# Patient Record
Sex: Male | Born: 1960 | Race: Black or African American | Hispanic: No | Marital: Single | Smoking: Former smoker
Health system: Southern US, Community
[De-identification: ages and names within clinical notes are randomized; demographics above are authoritative.]

## PROBLEM LIST (undated history)

## (undated) DIAGNOSIS — K591 Functional diarrhea: Secondary | ICD-10-CM

## (undated) DIAGNOSIS — B59 Pneumocystosis: Secondary | ICD-10-CM

## (undated) DIAGNOSIS — E538 Deficiency of other specified B group vitamins: Secondary | ICD-10-CM

## (undated) DIAGNOSIS — S86819A Strain of other muscle(s) and tendon(s) at lower leg level, unspecified leg, initial encounter: Secondary | ICD-10-CM

## (undated) DIAGNOSIS — K219 Gastro-esophageal reflux disease without esophagitis: Secondary | ICD-10-CM

## (undated) DIAGNOSIS — B20 Human immunodeficiency virus [HIV] disease: Secondary | ICD-10-CM

## (undated) DIAGNOSIS — E785 Hyperlipidemia, unspecified: Secondary | ICD-10-CM

## (undated) DIAGNOSIS — Z21 Asymptomatic human immunodeficiency virus [HIV] infection status: Secondary | ICD-10-CM

## (undated) DIAGNOSIS — B370 Candidal stomatitis: Secondary | ICD-10-CM

## (undated) HISTORY — DX: Functional diarrhea: K59.1

## (undated) HISTORY — DX: Human immunodeficiency virus (HIV) disease: B20

## (undated) HISTORY — DX: Asymptomatic human immunodeficiency virus (hiv) infection status: Z21

## (undated) HISTORY — DX: Pneumocystosis: B59

## (undated) HISTORY — DX: Deficiency of other specified B group vitamins: E53.8

## (undated) HISTORY — DX: Strain of other muscle(s) and tendon(s) at lower leg level, unspecified leg, initial encounter: S86.819A

## (undated) HISTORY — DX: Gastro-esophageal reflux disease without esophagitis: K21.9

## (undated) HISTORY — DX: Candidal stomatitis: B37.0

## (undated) HISTORY — DX: Hyperlipidemia, unspecified: E78.5

---

## 1998-04-13 ENCOUNTER — Encounter: Admission: RE | Admit: 1998-04-13 | Discharge: 1998-04-13 | Payer: Self-pay | Admitting: Internal Medicine

## 1998-12-01 ENCOUNTER — Encounter: Payer: Self-pay | Admitting: Emergency Medicine

## 1998-12-01 ENCOUNTER — Inpatient Hospital Stay (HOSPITAL_COMMUNITY): Admission: EM | Admit: 1998-12-01 | Discharge: 1998-12-04 | Payer: Self-pay | Admitting: Emergency Medicine

## 1999-05-01 ENCOUNTER — Ambulatory Visit (HOSPITAL_COMMUNITY): Admission: RE | Admit: 1999-05-01 | Discharge: 1999-05-01 | Payer: Self-pay | Admitting: Internal Medicine

## 1999-05-01 ENCOUNTER — Encounter: Admission: RE | Admit: 1999-05-01 | Discharge: 1999-05-01 | Payer: Self-pay | Admitting: Internal Medicine

## 1999-06-12 ENCOUNTER — Ambulatory Visit (HOSPITAL_COMMUNITY): Admission: RE | Admit: 1999-06-12 | Discharge: 1999-06-12 | Payer: Self-pay | Admitting: Internal Medicine

## 1999-06-12 ENCOUNTER — Encounter: Admission: RE | Admit: 1999-06-12 | Discharge: 1999-06-12 | Payer: Self-pay | Admitting: Internal Medicine

## 1999-06-26 ENCOUNTER — Encounter: Admission: RE | Admit: 1999-06-26 | Discharge: 1999-06-26 | Payer: Self-pay | Admitting: Hematology and Oncology

## 1999-08-18 ENCOUNTER — Encounter: Payer: Self-pay | Admitting: Emergency Medicine

## 1999-08-18 ENCOUNTER — Emergency Department (HOSPITAL_COMMUNITY): Admission: EM | Admit: 1999-08-18 | Discharge: 1999-08-18 | Payer: Self-pay | Admitting: Emergency Medicine

## 1999-10-02 ENCOUNTER — Ambulatory Visit (HOSPITAL_COMMUNITY): Admission: RE | Admit: 1999-10-02 | Discharge: 1999-10-02 | Payer: Self-pay | Admitting: Internal Medicine

## 1999-10-02 ENCOUNTER — Encounter: Admission: RE | Admit: 1999-10-02 | Discharge: 1999-10-02 | Payer: Self-pay | Admitting: Internal Medicine

## 1999-12-31 ENCOUNTER — Encounter: Admission: RE | Admit: 1999-12-31 | Discharge: 1999-12-31 | Payer: Self-pay | Admitting: Internal Medicine

## 1999-12-31 ENCOUNTER — Ambulatory Visit (HOSPITAL_COMMUNITY): Admission: RE | Admit: 1999-12-31 | Discharge: 1999-12-31 | Payer: Self-pay | Admitting: Internal Medicine

## 2000-02-25 ENCOUNTER — Encounter: Admission: RE | Admit: 2000-02-25 | Discharge: 2000-02-25 | Payer: Self-pay | Admitting: Internal Medicine

## 2000-05-12 ENCOUNTER — Ambulatory Visit (HOSPITAL_COMMUNITY): Admission: RE | Admit: 2000-05-12 | Discharge: 2000-05-12 | Payer: Self-pay | Admitting: Internal Medicine

## 2000-05-12 ENCOUNTER — Encounter: Admission: RE | Admit: 2000-05-12 | Discharge: 2000-05-12 | Payer: Self-pay | Admitting: Internal Medicine

## 2000-09-29 ENCOUNTER — Encounter: Admission: RE | Admit: 2000-09-29 | Discharge: 2000-09-29 | Payer: Self-pay | Admitting: Internal Medicine

## 2000-12-01 ENCOUNTER — Encounter: Admission: RE | Admit: 2000-12-01 | Discharge: 2000-12-01 | Payer: Self-pay | Admitting: Internal Medicine

## 2000-12-14 ENCOUNTER — Inpatient Hospital Stay (HOSPITAL_COMMUNITY): Admission: EM | Admit: 2000-12-14 | Discharge: 2000-12-17 | Payer: Self-pay | Admitting: Emergency Medicine

## 2000-12-14 ENCOUNTER — Encounter: Payer: Self-pay | Admitting: Emergency Medicine

## 2000-12-16 ENCOUNTER — Encounter: Payer: Self-pay | Admitting: Orthopedic Surgery

## 2001-01-08 ENCOUNTER — Encounter: Admission: RE | Admit: 2001-01-08 | Discharge: 2001-04-08 | Payer: Self-pay | Admitting: Orthopedic Surgery

## 2001-01-20 ENCOUNTER — Ambulatory Visit (HOSPITAL_COMMUNITY): Admission: RE | Admit: 2001-01-20 | Discharge: 2001-01-20 | Payer: Self-pay | Admitting: Internal Medicine

## 2001-01-20 ENCOUNTER — Encounter: Admission: RE | Admit: 2001-01-20 | Discharge: 2001-01-20 | Payer: Self-pay | Admitting: Internal Medicine

## 2001-04-09 ENCOUNTER — Encounter: Admission: RE | Admit: 2001-04-09 | Discharge: 2001-05-03 | Payer: Self-pay | Admitting: Orthopedic Surgery

## 2001-04-14 ENCOUNTER — Encounter: Admission: RE | Admit: 2001-04-14 | Discharge: 2001-04-14 | Payer: Self-pay | Admitting: Internal Medicine

## 2001-06-01 ENCOUNTER — Encounter: Admission: RE | Admit: 2001-06-01 | Discharge: 2001-06-01 | Payer: Self-pay | Admitting: Internal Medicine

## 2001-10-18 ENCOUNTER — Encounter: Admission: RE | Admit: 2001-10-18 | Discharge: 2001-10-18 | Payer: Self-pay | Admitting: Internal Medicine

## 2001-11-30 ENCOUNTER — Encounter: Admission: RE | Admit: 2001-11-30 | Discharge: 2001-11-30 | Payer: Self-pay | Admitting: Internal Medicine

## 2002-01-24 ENCOUNTER — Encounter: Admission: RE | Admit: 2002-01-24 | Discharge: 2002-01-24 | Payer: Self-pay | Admitting: Internal Medicine

## 2002-01-24 ENCOUNTER — Ambulatory Visit (HOSPITAL_COMMUNITY): Admission: RE | Admit: 2002-01-24 | Discharge: 2002-01-24 | Payer: Self-pay | Admitting: Internal Medicine

## 2002-03-09 ENCOUNTER — Encounter: Admission: RE | Admit: 2002-03-09 | Discharge: 2002-03-09 | Payer: Self-pay | Admitting: Internal Medicine

## 2002-07-25 ENCOUNTER — Encounter: Admission: RE | Admit: 2002-07-25 | Discharge: 2002-07-25 | Payer: Self-pay | Admitting: Internal Medicine

## 2002-09-27 ENCOUNTER — Encounter: Admission: RE | Admit: 2002-09-27 | Discharge: 2002-09-27 | Payer: Self-pay | Admitting: Internal Medicine

## 2002-10-31 ENCOUNTER — Encounter: Admission: RE | Admit: 2002-10-31 | Discharge: 2002-10-31 | Payer: Self-pay | Admitting: Internal Medicine

## 2003-01-16 ENCOUNTER — Encounter: Admission: RE | Admit: 2003-01-16 | Discharge: 2003-01-16 | Payer: Self-pay | Admitting: Internal Medicine

## 2003-02-09 ENCOUNTER — Encounter: Admission: RE | Admit: 2003-02-09 | Discharge: 2003-05-10 | Payer: Self-pay | Admitting: Internal Medicine

## 2003-05-01 ENCOUNTER — Encounter: Admission: RE | Admit: 2003-05-01 | Discharge: 2003-05-01 | Payer: Self-pay | Admitting: Internal Medicine

## 2003-05-18 ENCOUNTER — Encounter: Admission: RE | Admit: 2003-05-18 | Discharge: 2003-05-18 | Payer: Self-pay | Admitting: Internal Medicine

## 2003-06-02 ENCOUNTER — Encounter: Admission: RE | Admit: 2003-06-02 | Discharge: 2003-06-02 | Payer: Self-pay | Admitting: Internal Medicine

## 2003-08-23 ENCOUNTER — Encounter: Admission: RE | Admit: 2003-08-23 | Discharge: 2003-08-23 | Payer: Self-pay | Admitting: Internal Medicine

## 2003-08-23 ENCOUNTER — Ambulatory Visit (HOSPITAL_COMMUNITY): Admission: RE | Admit: 2003-08-23 | Discharge: 2003-08-23 | Payer: Self-pay | Admitting: Internal Medicine

## 2003-08-23 ENCOUNTER — Encounter: Payer: Self-pay | Admitting: Internal Medicine

## 2004-01-05 ENCOUNTER — Encounter: Admission: RE | Admit: 2004-01-05 | Discharge: 2004-01-05 | Payer: Self-pay | Admitting: Internal Medicine

## 2004-01-12 ENCOUNTER — Encounter: Admission: RE | Admit: 2004-01-12 | Discharge: 2004-01-12 | Payer: Self-pay | Admitting: Internal Medicine

## 2004-01-12 ENCOUNTER — Ambulatory Visit (HOSPITAL_COMMUNITY): Admission: RE | Admit: 2004-01-12 | Discharge: 2004-01-12 | Payer: Self-pay | Admitting: Internal Medicine

## 2004-08-28 ENCOUNTER — Ambulatory Visit (HOSPITAL_COMMUNITY): Admission: RE | Admit: 2004-08-28 | Discharge: 2004-08-28 | Payer: Self-pay | Admitting: Internal Medicine

## 2004-08-28 ENCOUNTER — Ambulatory Visit: Payer: Self-pay | Admitting: Internal Medicine

## 2004-11-20 ENCOUNTER — Ambulatory Visit: Payer: Self-pay | Admitting: Internal Medicine

## 2004-11-27 ENCOUNTER — Ambulatory Visit: Payer: Self-pay | Admitting: Internal Medicine

## 2004-11-27 ENCOUNTER — Ambulatory Visit (HOSPITAL_COMMUNITY): Admission: RE | Admit: 2004-11-27 | Discharge: 2004-11-27 | Payer: Self-pay | Admitting: Infectious Diseases

## 2004-12-26 ENCOUNTER — Ambulatory Visit: Payer: Self-pay | Admitting: Internal Medicine

## 2004-12-26 ENCOUNTER — Ambulatory Visit (HOSPITAL_COMMUNITY): Admission: RE | Admit: 2004-12-26 | Discharge: 2004-12-26 | Payer: Self-pay | Admitting: Internal Medicine

## 2005-03-31 ENCOUNTER — Ambulatory Visit: Payer: Self-pay | Admitting: Internal Medicine

## 2005-03-31 ENCOUNTER — Ambulatory Visit (HOSPITAL_COMMUNITY): Admission: RE | Admit: 2005-03-31 | Discharge: 2005-03-31 | Payer: Self-pay | Admitting: Internal Medicine

## 2005-11-26 ENCOUNTER — Ambulatory Visit: Payer: Self-pay | Admitting: Internal Medicine

## 2005-11-26 ENCOUNTER — Ambulatory Visit (HOSPITAL_COMMUNITY): Admission: RE | Admit: 2005-11-26 | Discharge: 2005-11-26 | Payer: Self-pay | Admitting: Internal Medicine

## 2005-12-05 ENCOUNTER — Ambulatory Visit: Payer: Self-pay | Admitting: Internal Medicine

## 2006-03-16 ENCOUNTER — Ambulatory Visit: Payer: Self-pay | Admitting: Internal Medicine

## 2006-03-16 ENCOUNTER — Encounter: Admission: RE | Admit: 2006-03-16 | Discharge: 2006-03-16 | Payer: Self-pay | Admitting: Internal Medicine

## 2006-03-20 ENCOUNTER — Ambulatory Visit: Payer: Self-pay | Admitting: Internal Medicine

## 2006-03-27 ENCOUNTER — Ambulatory Visit: Payer: Self-pay | Admitting: Internal Medicine

## 2006-04-27 ENCOUNTER — Ambulatory Visit: Payer: Self-pay | Admitting: Internal Medicine

## 2006-05-04 ENCOUNTER — Ambulatory Visit: Payer: Self-pay | Admitting: Internal Medicine

## 2006-05-15 ENCOUNTER — Ambulatory Visit: Payer: Self-pay | Admitting: Internal Medicine

## 2006-05-22 ENCOUNTER — Ambulatory Visit: Payer: Self-pay | Admitting: Internal Medicine

## 2006-06-22 ENCOUNTER — Ambulatory Visit: Payer: Self-pay | Admitting: Internal Medicine

## 2006-07-22 ENCOUNTER — Ambulatory Visit: Payer: Self-pay | Admitting: Internal Medicine

## 2006-09-18 DIAGNOSIS — B20 Human immunodeficiency virus [HIV] disease: Secondary | ICD-10-CM | POA: Insufficient documentation

## 2006-09-18 DIAGNOSIS — E785 Hyperlipidemia, unspecified: Secondary | ICD-10-CM | POA: Insufficient documentation

## 2006-09-18 DIAGNOSIS — B59 Pneumocystosis: Secondary | ICD-10-CM | POA: Insufficient documentation

## 2006-09-18 DIAGNOSIS — E538 Deficiency of other specified B group vitamins: Secondary | ICD-10-CM | POA: Insufficient documentation

## 2006-09-18 DIAGNOSIS — M66269 Spontaneous rupture of extensor tendons, unspecified lower leg: Secondary | ICD-10-CM | POA: Insufficient documentation

## 2006-09-18 DIAGNOSIS — B370 Candidal stomatitis: Secondary | ICD-10-CM | POA: Insufficient documentation

## 2006-10-22 ENCOUNTER — Emergency Department (HOSPITAL_COMMUNITY): Admission: EM | Admit: 2006-10-22 | Discharge: 2006-10-22 | Payer: Self-pay | Admitting: Emergency Medicine

## 2006-11-02 ENCOUNTER — Ambulatory Visit: Payer: Self-pay | Admitting: Internal Medicine

## 2006-11-02 ENCOUNTER — Encounter: Admission: RE | Admit: 2006-11-02 | Discharge: 2006-11-02 | Payer: Self-pay | Admitting: Internal Medicine

## 2006-12-08 ENCOUNTER — Telehealth: Payer: Self-pay | Admitting: Internal Medicine

## 2007-03-18 ENCOUNTER — Telehealth (INDEPENDENT_AMBULATORY_CARE_PROVIDER_SITE_OTHER): Payer: Self-pay | Admitting: *Deleted

## 2007-07-27 ENCOUNTER — Encounter (INDEPENDENT_AMBULATORY_CARE_PROVIDER_SITE_OTHER): Payer: Self-pay | Admitting: Internal Medicine

## 2007-07-27 ENCOUNTER — Ambulatory Visit: Payer: Self-pay | Admitting: Internal Medicine

## 2007-07-27 ENCOUNTER — Encounter: Admission: RE | Admit: 2007-07-27 | Discharge: 2007-07-27 | Payer: Self-pay | Admitting: Internal Medicine

## 2007-07-29 LAB — CONVERTED CEMR LAB
Alkaline Phosphatase: 72 units/L (ref 39–117)
BUN: 12 mg/dL (ref 6–23)
CO2: 19 meq/L (ref 19–32)
Cholesterol: 234 mg/dL — ABNORMAL HIGH (ref 0–200)
Creatinine, Ser: 0.9 mg/dL (ref 0.40–1.50)
Glucose, Bld: 94 mg/dL (ref 70–99)
HCT: 44.4 % (ref 39.0–52.0)
HDL: 54 mg/dL (ref >39)
LDL Cholesterol: 132 mg/dL — ABNORMAL HIGH (ref 0–99)
MCHC: 36.9 g/dL — ABNORMAL HIGH (ref 30.0–36.0)
MCV: 107.8 fL — ABNORMAL HIGH (ref 78.0–100.0)
RBC: 4.12 M/uL — ABNORMAL LOW (ref 4.22–5.81)
Sodium: 141 meq/L (ref 135–145)
Total Bilirubin: 0.5 mg/dL (ref 0.3–1.2)
Total CHOL/HDL Ratio: 4.3
Triglycerides: 238 mg/dL — ABNORMAL HIGH (ref <150)
VLDL: 48 mg/dL — ABNORMAL HIGH (ref 0–40)
WBC: 6.8 10*3/uL (ref 4.0–10.5)

## 2007-10-07 ENCOUNTER — Ambulatory Visit: Payer: Self-pay | Admitting: Internal Medicine

## 2007-10-07 DIAGNOSIS — K5900 Constipation, unspecified: Secondary | ICD-10-CM | POA: Insufficient documentation

## 2008-03-16 IMAGING — CR DG CHEST 2V
2 series · 2 of 2 positions shown · non-contrast
Comparison: 03/31/05.

CLINICAL DATA: Right chest pain with deep inspiration.
 CHEST ? 2 VIEW:

[view not recorded (1 of 2)]
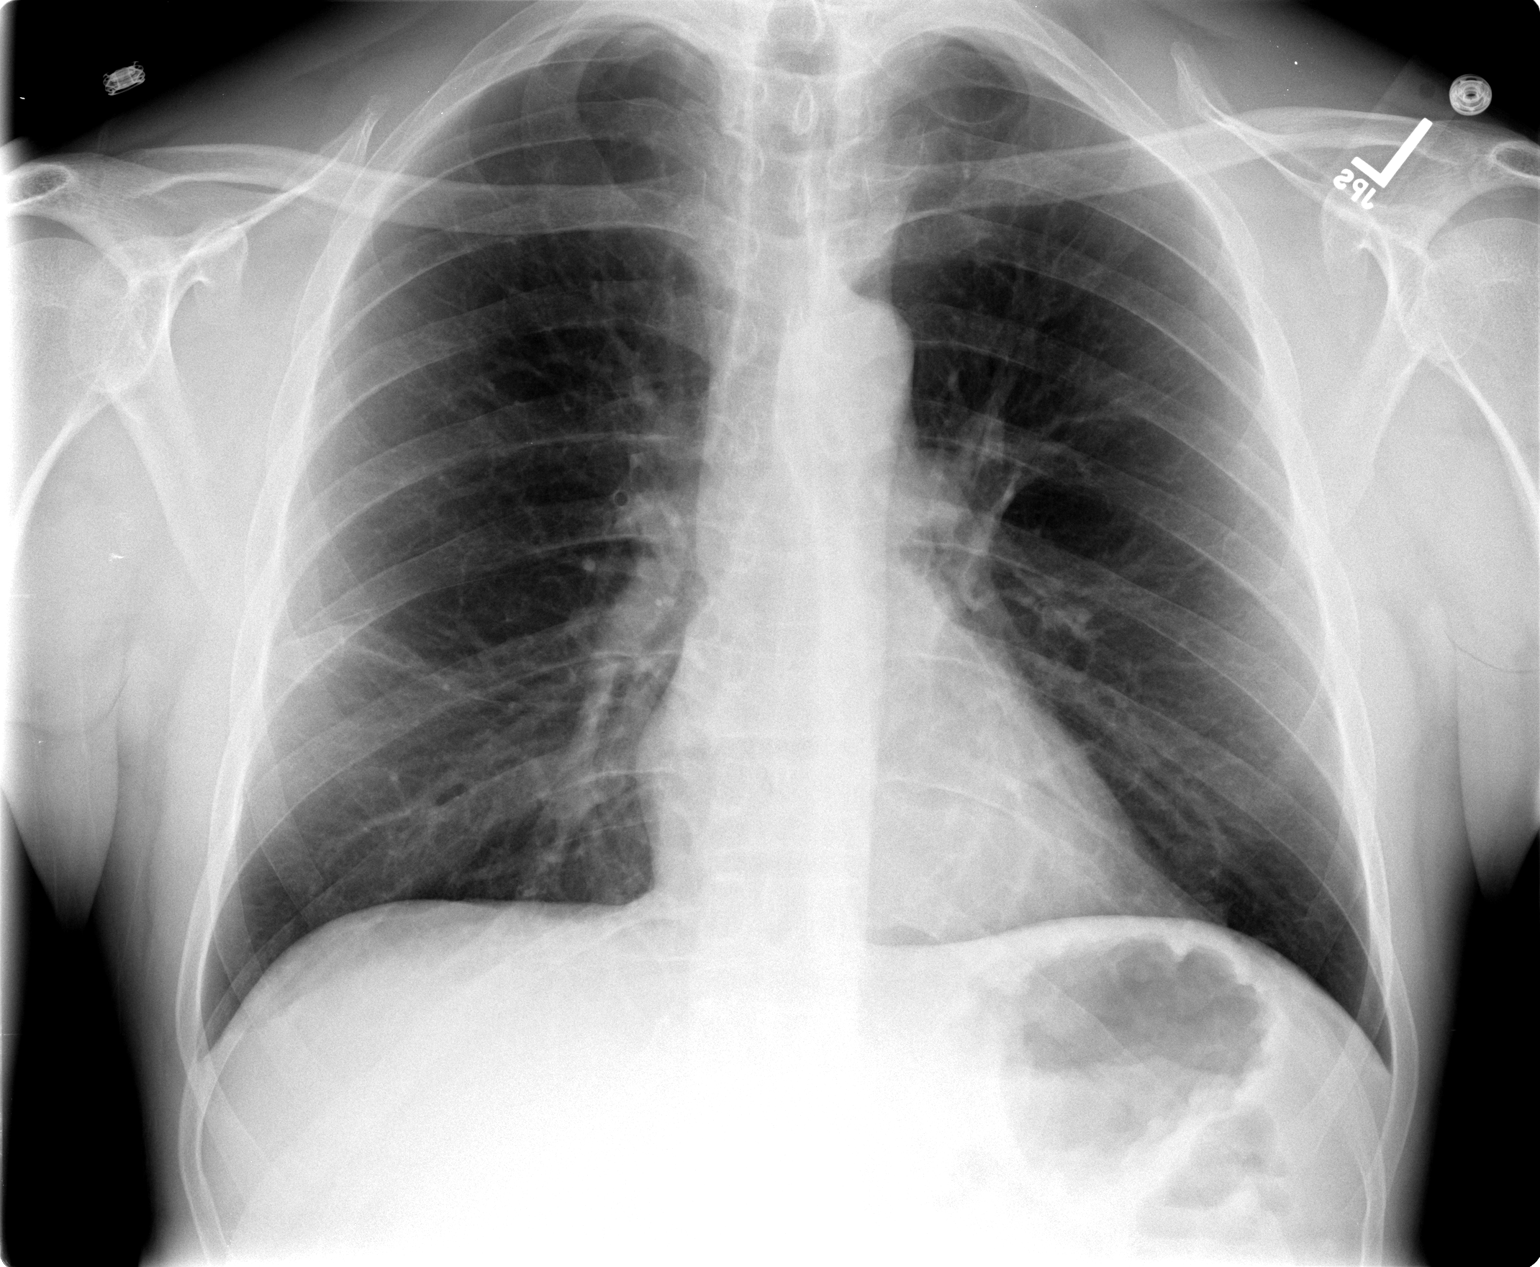

[view not recorded (2 of 2)]
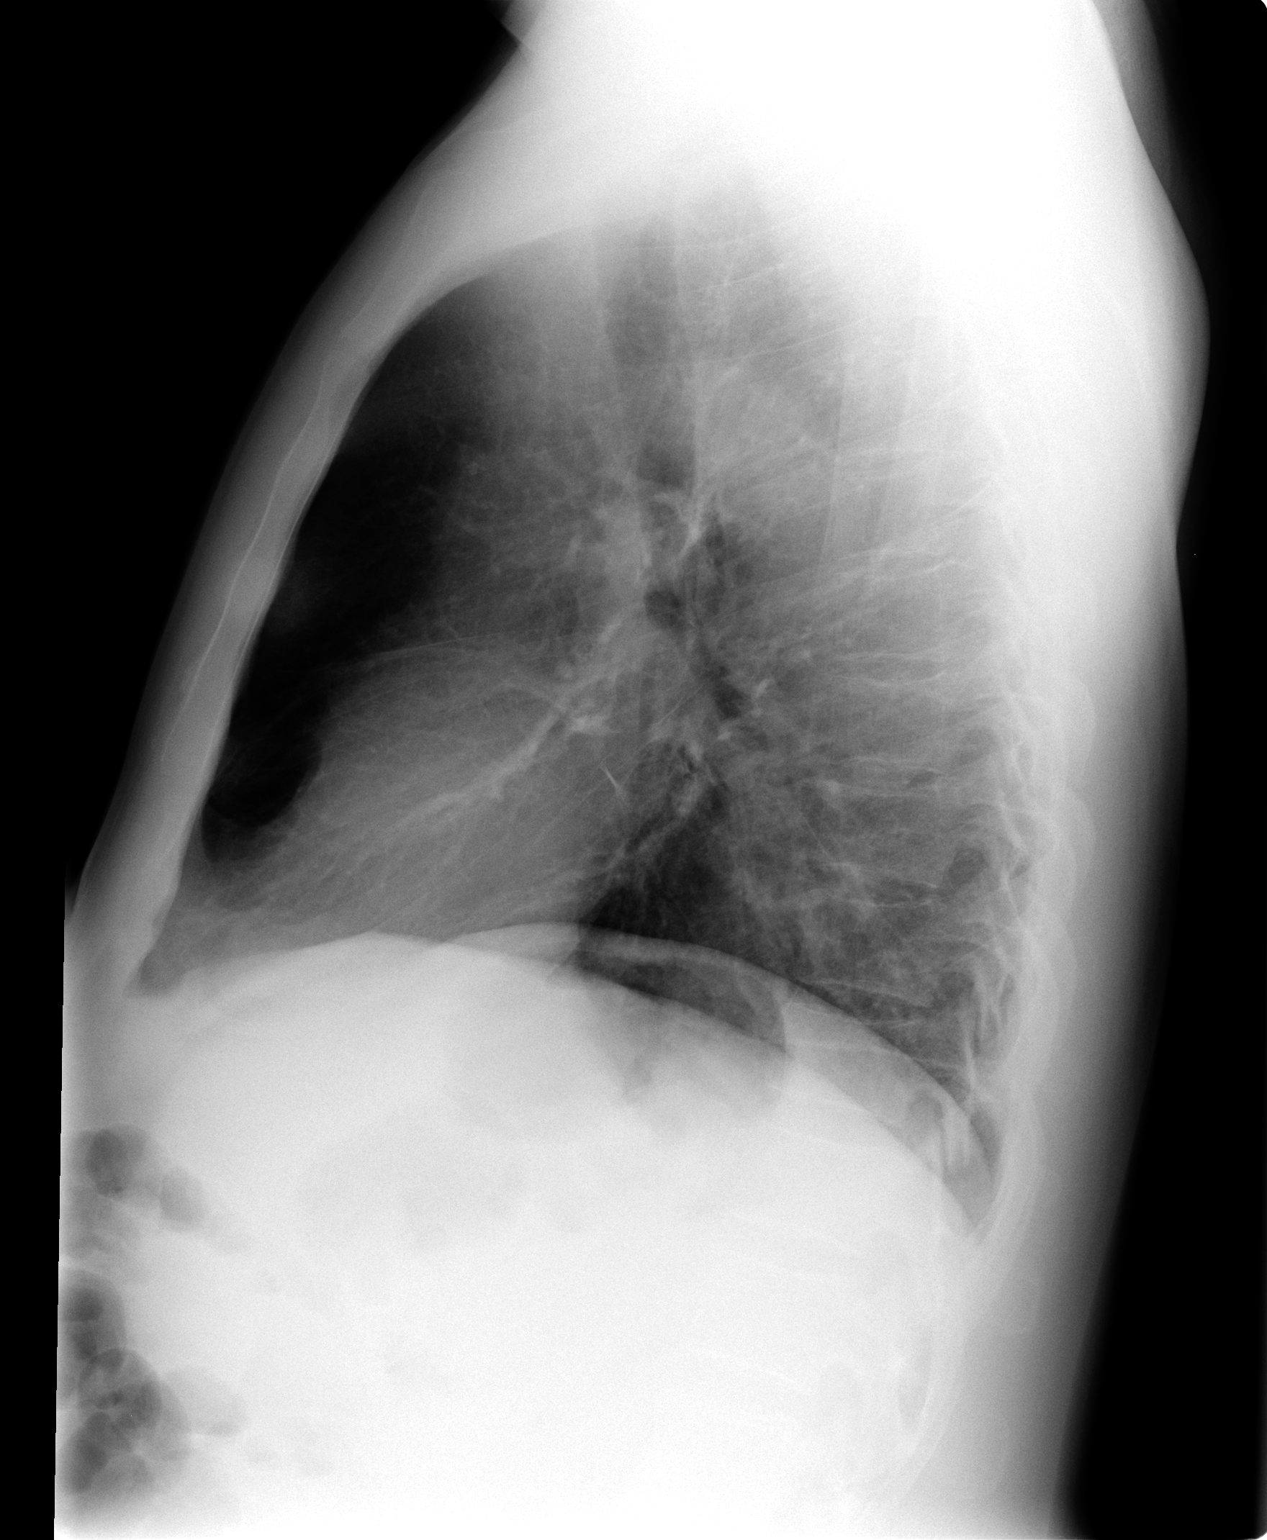

[2 of 2 positions shown; findings below may reference images not displayed]

FINDINGS: Heart and vascularity normal.  Peribronchial thickening and hyperaeration consistent with at least some degree of COPD.  No pneumothorax or acute findings.  No pleural fluid.  Osseous structures intact.
IMPRESSION: No active disease.  There are chronic lung changes as described above.

## 2008-06-13 ENCOUNTER — Encounter: Payer: Self-pay | Admitting: Internal Medicine

## 2008-06-13 ENCOUNTER — Ambulatory Visit: Payer: Self-pay | Admitting: Internal Medicine

## 2008-06-13 ENCOUNTER — Encounter: Admission: RE | Admit: 2008-06-13 | Discharge: 2008-06-13 | Payer: Self-pay | Admitting: Internal Medicine

## 2008-06-13 DIAGNOSIS — K219 Gastro-esophageal reflux disease without esophagitis: Secondary | ICD-10-CM | POA: Insufficient documentation

## 2008-06-13 LAB — CONVERTED CEMR LAB
AST: 21 units/L (ref 0–37)
Alkaline Phosphatase: 76 units/L (ref 39–117)
Basophils Relative: 0 % (ref 0–1)
Eosinophils Absolute: 0 10*3/uL (ref 0.0–0.7)
Eosinophils Relative: 0 % (ref 0–5)
Glucose, Bld: 93 mg/dL (ref 70–99)
HCT: 46.1 % (ref 39.0–52.0)
HDL: 51 mg/dL (ref >39)
Hepatitis B Surface Ag: NEGATIVE
Lymphs Abs: 3.2 10*3/uL (ref 0.7–4.0)
MCHC: 35.8 g/dL (ref 30.0–36.0)
MCV: 108.5 fL — ABNORMAL HIGH (ref 78.0–100.0)
Neutrophils Relative %: 56 % (ref 43–77)
Platelets: 213 10*3/uL (ref 150–400)
RDW: 13.8 % (ref 11.5–15.5)
Sodium: 140 meq/L (ref 135–145)
Total Bilirubin: 0.4 mg/dL (ref 0.3–1.2)
Total Protein: 8.3 g/dL (ref 6.0–8.3)
Triglycerides: 434 mg/dL — ABNORMAL HIGH (ref <150)
WBC: 8.7 10*3/uL (ref 4.0–10.5)

## 2008-07-17 ENCOUNTER — Encounter: Payer: Self-pay | Admitting: Internal Medicine

## 2008-07-17 ENCOUNTER — Ambulatory Visit: Payer: Self-pay | Admitting: Internal Medicine

## 2008-07-17 DIAGNOSIS — E872 Acidosis, unspecified: Secondary | ICD-10-CM | POA: Insufficient documentation

## 2008-07-17 LAB — CONVERTED CEMR LAB
BUN: 13 mg/dL (ref 6–23)
CO2: 22 meq/L (ref 19–32)
Chloride: 107 meq/L (ref 96–112)
Creatinine, Ser: 0.92 mg/dL (ref 0.40–1.50)

## 2008-08-24 ENCOUNTER — Ambulatory Visit: Payer: Self-pay | Admitting: Internal Medicine

## 2008-11-13 ENCOUNTER — Telehealth: Payer: Self-pay | Admitting: Infectious Diseases

## 2009-01-17 ENCOUNTER — Telehealth: Payer: Self-pay | Admitting: *Deleted

## 2009-05-25 ENCOUNTER — Encounter: Payer: Self-pay | Admitting: Internal Medicine

## 2009-05-25 ENCOUNTER — Ambulatory Visit: Payer: Self-pay | Admitting: Internal Medicine

## 2009-05-25 LAB — CONVERTED CEMR LAB
BUN: 6 mg/dL (ref 6–23)
CO2: 24 meq/L (ref 19–32)
Cholesterol: 190 mg/dL (ref 0–200)
Creatinine, Ser: 0.84 mg/dL (ref 0.40–1.50)
Glucose, Bld: 99 mg/dL (ref 70–99)
HCT: 46.7 % (ref 39.0–52.0)
HIV-1 RNA Quant, Log: 1.75 — ABNORMAL HIGH (ref <1.68)
Hemoglobin: 16.4 g/dL (ref 13.0–17.0)
MCV: 116.5 fL — ABNORMAL HIGH (ref 78.0–100.0)
RBC: 4.01 M/uL — ABNORMAL LOW (ref 4.22–5.81)
Total Bilirubin: 0.7 mg/dL (ref 0.3–1.2)
Total CHOL/HDL Ratio: 3.8
Total lymphocyte count: 2698 cells/mcL (ref 700–3300)
VLDL: 49 mg/dL — ABNORMAL HIGH (ref 0–40)
Vitamin B-12: 540 pg/mL (ref 211–911)
WBC: 7.2 10*3/uL (ref 4.0–10.5)

## 2009-09-17 ENCOUNTER — Telehealth: Payer: Self-pay | Admitting: Internal Medicine

## 2009-09-19 ENCOUNTER — Emergency Department (HOSPITAL_COMMUNITY): Admission: EM | Admit: 2009-09-19 | Discharge: 2009-09-19 | Payer: Self-pay | Admitting: Emergency Medicine

## 2009-09-19 ENCOUNTER — Encounter (INDEPENDENT_AMBULATORY_CARE_PROVIDER_SITE_OTHER): Payer: Self-pay | Admitting: Emergency Medicine

## 2009-09-19 ENCOUNTER — Ambulatory Visit: Payer: Self-pay | Admitting: Vascular Surgery

## 2009-09-25 ENCOUNTER — Ambulatory Visit: Payer: Self-pay | Admitting: Internal Medicine

## 2009-09-25 DIAGNOSIS — I82409 Acute embolism and thrombosis of unspecified deep veins of unspecified lower extremity: Secondary | ICD-10-CM | POA: Insufficient documentation

## 2009-09-25 LAB — CONVERTED CEMR LAB
BUN: 8 mg/dL (ref 6–23)
Basophils Absolute: 0 10*3/uL (ref 0.0–0.1)
Chloride: 104 meq/L (ref 96–112)
Eosinophils Relative: 1 % (ref 0–5)
HCT: 41.6 % (ref 39.0–52.0)
HIV-1 RNA Quant, Log: <1.68 (ref <1.68)
Hemoglobin: 14.5 g/dL (ref 13.0–17.0)
INR: 1.13 (ref <1.50)
Lymphocytes Relative: 37 % (ref 12–46)
Monocytes Absolute: 0.5 10*3/uL (ref 0.1–1.0)
Potassium: 3.3 meq/L — ABNORMAL LOW (ref 3.5–5.3)
RDW: 13.4 % (ref 11.5–15.5)
Sodium: 137 meq/L (ref 135–145)
aPTT: 32 s (ref 24–37)

## 2009-09-28 ENCOUNTER — Ambulatory Visit: Payer: Self-pay | Admitting: Internal Medicine

## 2009-09-28 LAB — CONVERTED CEMR LAB
HCT: 42.7 % (ref 39.0–52.0)
Hemoglobin: 14.8 g/dL (ref 13.0–17.0)
RBC: 3.61 M/uL — ABNORMAL LOW (ref 4.22–5.81)
WBC: 6.8 10*3/uL (ref 4.0–10.5)

## 2009-10-05 ENCOUNTER — Telehealth: Payer: Self-pay | Admitting: Internal Medicine

## 2009-10-08 ENCOUNTER — Ambulatory Visit: Payer: Self-pay | Admitting: Infectious Disease

## 2009-10-08 LAB — CONVERTED CEMR LAB: INR: 1.6

## 2009-10-16 ENCOUNTER — Telehealth: Payer: Self-pay | Admitting: Internal Medicine

## 2009-10-29 ENCOUNTER — Ambulatory Visit: Payer: Self-pay | Admitting: Internal Medicine

## 2009-10-29 LAB — CONVERTED CEMR LAB

## 2010-01-16 ENCOUNTER — Telehealth: Payer: Self-pay | Admitting: Internal Medicine

## 2010-03-20 ENCOUNTER — Telehealth: Payer: Self-pay | Admitting: Internal Medicine

## 2010-04-19 ENCOUNTER — Telehealth: Payer: Self-pay | Admitting: Internal Medicine

## 2010-05-23 ENCOUNTER — Telehealth: Payer: Self-pay | Admitting: Internal Medicine

## 2010-06-20 ENCOUNTER — Encounter: Payer: Self-pay | Admitting: Internal Medicine

## 2010-06-20 ENCOUNTER — Ambulatory Visit: Payer: Self-pay | Admitting: Internal Medicine

## 2010-06-20 LAB — CONVERTED CEMR LAB
AST: 26 units/L (ref 0–37)
Albumin: 5.1 g/dL (ref 3.5–5.2)
Alkaline Phosphatase: 66 units/L (ref 39–117)
Basophils Absolute: 0 10*3/uL (ref 0.0–0.1)
Basophils Relative: 0 % (ref 0–1)
Eosinophils Absolute: 0 10*3/uL (ref 0.0–0.7)
HDL: 56 mg/dL (ref >39)
HIV-1 RNA Quant, Log: <1.68 (ref <1.68)
Hemoglobin: 15.4 g/dL (ref 13.0–17.0)
MCHC: 35.6 g/dL (ref 30.0–36.0)
MCV: 108.3 fL — ABNORMAL HIGH (ref >=78.0)
Monocytes Absolute: 0.5 10*3/uL (ref 0.1–1.0)
Neutro Abs: 3.7 10*3/uL (ref 1.7–7.7)
Neutrophils Relative %: 52 % (ref 43–77)
Potassium: 3.8 meq/L (ref 3.5–5.3)
RDW: 14.1 % (ref 11.5–15.5)
Sodium: 139 meq/L (ref 135–145)
Total Protein: 8 g/dL (ref 6.0–8.3)
Vitamin B-12: 729 pg/mL (ref 211–911)

## 2010-10-17 ENCOUNTER — Telehealth: Payer: Self-pay | Admitting: Internal Medicine

## 2010-12-11 ENCOUNTER — Telehealth: Payer: Self-pay | Admitting: Internal Medicine

## 2010-12-15 LAB — CONVERTED CEMR LAB: Vitamin B-12: >2000 pg/mL — ABNORMAL HIGH (ref 211–911)

## 2010-12-17 NOTE — Progress Notes (Signed)
Summary: Refill  Phone Note Refill Request Message from:  Patient on May 23, 2010 3:29 PM  Refills Requested: Medication #1:  PROTONIX 40 MG TBEC Take one tablet by mouth daily Pt says that he is taking the Protonix 2 times a day.  The directions say 1 daily.  Amount given says # 60.  Need the directions changed to say 1 twice daily .  Pt said that he has been taking 2 daily.  Pharmacy refused to fill because of the directions.   Method Requested: Electronic Initial call taken by: Angelina Ok RN,  May 23, 2010 3:31 PM    New/Updated Medications: PROTONIX 20 MG TBEC (PANTOPRAZOLE SODIUM) Take 1 tablet by mouth two times a day Prescriptions: PROTONIX 20 MG TBEC (PANTOPRAZOLE SODIUM) Take 1 tablet by mouth two times a day  #60 x 3   Entered and Authorized by:   Laren Everts MD   Signed by:   Laren Everts MD on 05/24/2010   Method used:   Electronically to        [facility] [phone] * (retail)       [address]       [place], [place]  [address]       Ph: [phone]       Fax: [phone]   RxID:   [id number]

## 2010-12-17 NOTE — Progress Notes (Signed)
Summary: med refill/gp  Phone Note Refill Request Message from:  Pharmacy on Mar 20, 2010 2:45 PM  Refills Requested: Medication #1:  LIPITOR 80 MG TABS Take 1 tablet by mouth once a day   Last Refilled: 02/14/2010  Method Requested: Electronic Initial call taken by: Chinita Pester RN,  Mar 20, 2010 2:45 PM    Prescriptions: LIPITOR 80 MG TABS (ATORVASTATIN CALCIUM) Take 1 tablet by mouth once a day  #30 x 6   Entered and Authorized by:   Laren Everts MD   Signed by:   Laren Everts MD on 03/20/2010   Method used:   Electronically to        [facility] [phone] * (retail)       [address]       [place], [place]  [address]       Ph: [phone]       Fax: [phone]   RxID:   [id number]

## 2010-12-17 NOTE — Assessment & Plan Note (Signed)
Summary: EST-CK/FU/MEDS/CFB   Vital Signs:  Patient profile:   50 year old male Height:      72 inches (182.88 cm) Weight:      179.02 pounds (81.37 kg) BMI:     24.37 Temp:     98.4 degrees F (36.89 degrees C) oral Pulse rate:   98 / minute BP sitting:   114 / 79  (right arm)  Vitals Entered By: Angelina Ok RN (June 20, 2010 3:33 PM) CC: Depression Is Patient Diabetic? No Pain Assessment Patient in pain? no      Nutritional Status BMI of 19 -24 = normal  Have you ever been in a relationship where you felt threatened, hurt or afraid?No   Does patient need assistance? Functional Status Self care Ambulation Normal Comments Needs refills on meds. Check up.    Primary Care Day Deery:  Laren Everts MD  CC:  Depression.  History of Present Illness: 50 yr old man with pmhx as described below comes to the clinic for regular check up. Patient has no complains. 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 He will take for one year. He is getting his INR checked in a clinic in [place]. He would like to get yearly blood work done today.  Depression History:      The patient denies a depressed mood most of the day and a diminished interest in his usual daily activities.         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 Take One Tablet By Mouth Two Times A Day 2)  Sustiva 600 Mg Tabs (Efavirenz) .... Take One Tablet By Mouth Once Daily 3)  Cyanocobalamin 1000 Mcg/ml Soln (Cyanocobalamin) .... Inject Into The Muscle of Shoulder or Leg Once A Month 4)  Bd Eclipse Syringe 23g X 1" 3 Ml  Misc (Syringe/needle (Disp)) 5)  Lipitor 80 Mg Tabs (Atorvastatin Calcium) .... Take 1 Tablet By Mouth Once A Day 6)  Protonix 20 Mg Tbec (Pantoprazole Sodium) .... Take 1 Tablet By Mouth Two Times A Day 7)  Coumadin 5 Mg Tabs (Warfarin Sodium) .... Take 1  Tablet By Mouth Once A Day 8)  Lovenox 80 Mg/0.32ml Soln (Enoxaparin Sodium) .... Inject Subcutaneously Twice A Day  Current Medications (verified): 1)  Combivir 150-300 Mg Tabs (Lamivudine-Zidovudine) .... Take One Tablet By Mouth Two Times A Day 2)  Sustiva 600 Mg Tabs (Efavirenz) .... Take One Tablet By Mouth Once Daily 3)  Cyanocobalamin 1000 Mcg/ml Soln (Cyanocobalamin) .... Inject Into The Muscle of Shoulder or Leg Once A Month 4)  Bd Eclipse Syringe 23g X 1" 3 Ml  Misc (Syringe/needle (Disp)) 5)  Lipitor 80 Mg Tabs (Atorvastatin Calcium) .Marland KitchenMarland KitchenMarland Kitchen  Take 1 Tablet By Mouth Once A Day 6)  Protonix 20 Mg Tbec (Pantoprazole Sodium) .... Take 1 Tablet By Mouth Two Times A Day 7)  Coumadin 5 Mg Tabs (Warfarin Sodium) .... Take 1  Tablet By Mouth Once A Day  Allergies: 1)  ! Sulfa  Past History:  Past Medical History: Last updated: 06/13/2008 HIV disease Hyperlipidemia B-12 deficiency GERD History of Thrush History of PCP History of patellar tendon rupture Functional diarrhea, negative serologic/stool s/u refuses colonoscopy  Risk Factors: Exercise: yes (06/20/2010)  Risk Factors: Smoking Status: quit (06/20/2010)  Review of Systems  The patient denies fever, chest pain, dyspnea on exertion, peripheral edema, prolonged cough, headaches, hemoptysis,  abdominal pain, melena, hematochezia, hematuria, muscle weakness, and difficulty walking.    Physical Exam  General:  NAD Lungs:  Normal respiratory effort, chest expands symmetrically. Lungs are clear to auscultation, no crackles or wheezes. Heart:  Normal rate and regular rhythm. S1 and S2 normal without gallop, murmur, click, rub or other extra sounds. Abdomen:  Bowel sounds positive,abdomen soft and non-tender without masses, organomegaly or hernias noted. Msk:  normal ROM.   Extremities:  no edema Neurologic:  Nonfocal   Impression & Recommendations:  Problem # 1:  HIV DISEASE (ICD-042) Continue current regimen.  Orders: T-CD4 (21308-65784) T-CBC w/Diff [phone]) T-HIV Viral Load (32440-10272)  Problem # 2:  DVT (ICD-453.40) Continue coumadin and INR check per clinic in [place].  Problem # 3:  HYPERLIPIDEMIA (ICD-272.4) At goal. Continue current regimen.  His updated medication list for this problem includes:    Lipitor 80 Mg Tabs (Atorvastatin calcium) .Marland Kitchen... Take 1 tablet by mouth once a day  Orders: T-Lipid Profile ([address]64-40347) T-CMP with Estimated GFR ([address])  Labs Reviewed: SGOT: 29 (05/25/2009)   SGPT: 28 (05/25/2009)   HDL:50 (05/25/2009), 51 (06/13/2008)  LDL:91 (05/25/2009), See Comment mg/dL (56/43/3295)  JOAC:166 (05/25/2009), 271 (06/13/2008)  Trig:245 (05/25/2009), 434 (06/13/2008)  Problem # 4:  B12 DEFICIENCY (ICD-266.2) Vitamin B12 level wnl.  Orders: T-Vitamin B12 (06[address])  Complete Medication List: 1)  Combivir 150-300 Mg Tabs (Lamivudine-zidovudine) .... Take one tablet by mouth two times a day 2)  Sustiva 600 Mg Tabs (Efavirenz) .... Take one tablet by mouth once daily 3)  Cyanocobalamin 1000 Mcg/ml Soln (Cyanocobalamin) .... Inject into the muscle of shoulder or leg once a month 4)  Bd Eclipse Syringe 23g X 1" 3 Ml Misc (Syringe/needle (disp)) 5)  Lipitor 80 Mg Tabs (Atorvastatin calcium) .... Take 1 tablet by mouth  once a day 6)  Protonix 20 Mg Tbec (Pantoprazole sodium) .... Take 1 tablet by mouth two times a day 7)  Coumadin 5 Mg Tabs (Warfarin sodium) .... Take 1  tablet by mouth once a day  Patient Instructions: 1)  Please schedule a follow-up appointment in 6 months. 2)  Take all medication as directed. 3)  You will be called with any abnormalities in the tests scheduled or performed today.  If you don't hear from [place] within a week from when the test was performed, you can assume that your test was normal.  Prescriptions: COUMADIN 5 MG TABS (WARFARIN SODIUM) Take 1  tablet by mouth once a day  #50 x 2   Entered and Authorized by:   Laren Everts MD   Signed by:   Laren Everts MD on 06/20/2010   Method used:   [facility]ron[facility] to        [facility] [phone] * (retail)       [address]       [place], [place]  [address]       Ph: [phone]       Fax: [phone]   RxID:   [id number] PROTONIX 20 MG TBEC (PANTOPRAZOLE SODIUM) Take 1 tablet by mouth two times a day  #60 x 6   Entered and Authorized by:   Laren Everts MD   Signed by:   Laren Everts MD on 06/20/2010   Method used:   Electronically to        [facility] [phone] * (retail)       [address]       [place], [place]  [address]       Ph: [phone]       Fax: [phone]   RxID:   [id number] LIPITOR 80 MG TABS (ATORVASTATIN CALCIUM) Take 1 tablet by mouth once a day  #30 x 6   Entered and Authorized by:   Laren Everts MD   Signed by:   Laren Everts MD on 06/20/2010   Method used:   Electronically to        [facility] [phone] * (retail)       [address]       [place], [place]  [address]       Ph: [phone]       Fax: [phone]   RxID:   [id number] BD ECLIPSE SYRINGE 23G X 1" 3 ML  MISC (SYRINGE/NEEDLE (DISP))   #10 x 0   Entered and Authorized by:   Laren Everts MD   Signed by:   Laren Everts MD on  06/20/2010   Method used:   Faxed to ...       [facility] [phone] * (retail)       [address]       [place], [place]  [address]       Ph: [phone]       Fax: [phone]   RxID:   [id number] CYANOCOBALAMIN 1000 MCG/ML SOLN (CYANOCOBALAMIN) Inject into the muscle of shoulder or leg once a month  #1 x 6   Entered and Authorized by:   Laren Everts MD   Signed by:   Laren Everts MD on 06/20/2010   Method used:   Electronically to        [facility] [phone] * (retail)       [address]       [place], [place]  [address]       Ph: [phone]       Fax: [phone]   RxID:   [id number] SUSTIVA 600 MG TABS (EFAVIRENZ) Take one tablet by mouth once daily  #31 Tablet x 6   Entered and Authorized by:   Laren Everts MD   Signed by:   Laren Everts MD on 06/20/2010   Method used:   Electronically to        [facility] [phone] * (retail)       [address]       [place], [place]  [address]       Ph: [phone]       Fax: [phone]   RxID:   [id number] COMBIVIR 150-300 MG TABS (LAMIVUDINE-ZIDOVUDINE) Take one tablet by mouth two times a day  #62 Tablet x 6   Entered and Authorized by:   Laren Everts MD   Signed by:   Andee Poles  [place] MD on 06/20/2010   Method used:   Electronically to        [facility] [phone] * (retail)       [address]       [place], [place]  [address]       Ph: [phone]       Fax: [phone]   RxID:   [id number]   Prevention & Chronic Care Immunizations   Influenza vaccine: Fluvax 3+  (08/24/2008)   Influenza vaccine deferral: Refused  (09/25/2009)    Tetanus booster: Not documented   Td booster deferral: Deferred  (10/29/2009)    Pneumococcal vaccine: Historical  (08/28/2004)  Other Screening   PSA: Not documented   Smoking status: quit  (06/20/2010)  Lipids   Total Cholesterol: 190  (05/25/2009)   Lipid panel  action/deferral: Lipid Panel ordered   LDL: 91  (05/25/2009)   LDL Direct: Not documented   HDL: 50  (05/25/2009)   Triglycerides: 245  (05/25/2009)    SGOT (AST): 29  (05/25/2009)   BMP action: Ordered   SGPT (ALT): 28  (05/25/2009)   Alkaline phosphatase: 68  (05/25/2009)   Total bilirubin: 0.7  (05/25/2009)    Lipid flowsheet reviewed?: Yes   Progress toward LDL goal: At goal  Self-Management Support :   Personal Goals (by the next clinic visit) :      Personal LDL goal: 130  (10/29/2009)    Patient will work on the following items until the next clinic visit to reach self-care goals:     Medications and monitoring: take my medicines every day, bring all of my medications to every visit  (06/20/2010)     Eating: drink diet soda or water instead of juice or soda, eat more vegetables, use fresh or frozen vegetables, eat foods that are low in salt, eat baked foods instead of fried foods, eat fruit for snacks and desserts, limit or avoid alcohol  (06/20/2010)     Activity: take a 30 minute walk every day  (06/20/2010)    Lipid self-management support: Written self-care plan, Education handout, Pre-printed educational material, Resources for patients handout  (06/20/2010)   Lipid self-care plan printed.   Lipid education handout printed      Resource handout printed.    Vital Signs:  Patient profile:   50 year old male Height:      72 inches (182.88 cm) Weight:      179.02 pounds (81.37 kg) BMI:     24.37 Temp:     98.4 degrees F (36.89 degrees C) oral Pulse rate:   98 / minute BP sitting:   114 / 79  (right arm)  Vitals Entered By: Angelina Ok RN (June 20, 2010 3:33 PM)    Process Orders Check Orders Results:     [facility]: Check successful Tests Sent for requisitioning (June 21, 2010 5:08 PM):     06/20/2010: [facility] -- T-Lipid Profile [phone] (signed)     06/20/2010: [facility] -- T-CMP with  Estimated GFR [[id number]] (signed)     06/20/2010: [facility] -- T-CD4 [phone] (signed)     06/20/2010: [facility] -- T-CBC w/Diff [[id number]] (signed)     06/20/2010: [facility] -- T-Vitamin B12 [[id number]] (signed)     06/20/2010: [facility] -- T-HIV Viral Load [phone] (signed)

## 2010-12-17 NOTE — Progress Notes (Signed)
Summary: Refill/gh  Phone Note Refill Request Message from:  Fax from Pharmacy on April 19, 2010 11:08 AM  Refills Requested: Medication #1:  PROTONIX 40 MG TBEC Take one tablet by mouth daily   Last Refilled: 03/20/2010  Method Requested: Electronic Initial call taken by: Angelina Ok RN,  April 19, 2010 11:08 AM    Prescriptions: PROTONIX 40 MG TBEC (PANTOPRAZOLE SODIUM) Take one tablet by mouth daily  #60 x 6   Entered and Authorized by:   Laren Everts MD   Signed by:   Laren Everts MD on 04/20/2010   Method used:   Electronically to        [facility] [phone] * (retail)       [address]       [place], [place]  [address]       Ph: [phone]       Fax: [phone]   RxID:   [id number]

## 2010-12-17 NOTE — Progress Notes (Signed)
Summary: refills/ hla  Phone Note Refill Request Message from:  Patient on January 16, 2010 10:27 AM  Refills Requested: Medication #1:  COMBIVIR 150-300 MG TABS Take one tablet by mouth two times a day   Dosage confirmed as above?Dosage Confirmed   Last Refilled: 2/2  Medication #2:  SUSTIVA 600 MG TABS Take one tablet by mouth once daily   Dosage confirmed as above?Dosage Confirmed   Last Refilled: 2/2 last visit 12/13, next visit due 03/2010, is seeing someone in [place] area for coumadin  Initial call taken by: Marin Roberts RN,  January 16, 2010 10:28 AM    Prescriptions: SUSTIVA 600 MG TABS (EFAVIRENZ) Take one tablet by mouth once daily  #31 Tablet x 6   Entered and Authorized by:   Laren Everts MD   Signed by:   Laren Everts MD on 01/16/2010   Method used:   Electronically to        [facility] [phone] * (retail)       [address]       [place], [place]  [address]       Ph: [phone]       Fax: [phone]   RxID:   [id number] COMBIVIR 150-300 MG TABS (LAMIVUDINE-ZIDOVUDINE) Take one tablet by mouth two times a day  #62 Tablet x 6   Entered and Authorized by:   Laren Everts MD   Signed by:   Laren Everts MD on 01/16/2010   Method used:   Electronically to        [facility] [phone] * (retail)       [address]       [place], [place]  [address]       Ph: [phone]       Fax: [phone]   RxID:   [id number]

## 2010-12-17 NOTE — Progress Notes (Signed)
Summary: Refill/gh  Phone Note Refill Request Message from:  Patient on October 17, 2010 3:22 PM  Refills Requested: Medication #1:  CYANOCOBALAMIN 1000 MCG/ML SOLN Inject into the muscle of shoulder or leg once a month Pt recived on vial with 1 dose.  Wants a bottle with 5 doses.  Said that the 1 or 5 cost him the same.     Method Requested: Electronic Initial call taken by: Angelina Ok RN,  October 17, 2010 3:23 PM    Prescriptions: CYANOCOBALAMIN 1000 MCG/ML SOLN (CYANOCOBALAMIN) Inject into the muscle of shoulder or leg once a month  #5 x 2   Entered and Authorized by:   Laren Everts MD   Signed by:   Laren Everts MD on 10/17/2010   Method used:   Electronically to        [facility] [phone] * (retail)       [address]       [place], [place]  [address]       Ph: [phone]       Fax: [phone]   RxID:   [id number]

## 2010-12-19 NOTE — Progress Notes (Addendum)
Summary: refill/ hla  Phone Note Refill Request Message from:  Patient on December 11, 2010 8:54 AM  Refills Requested: Medication #1:  COMBIVIR 150-300 MG TABS Take one tablet by mouth two times a day   Dosage confirmed as above?Dosage Confirmed  Medication #2:  PROTONIX 20 MG TBEC Take 1 tablet by mouth two times a day   Dosage confirmed as above?Dosage Confirmed  Medication #3:  SUSTIVA 600 MG TABS Take one tablet by mouth once daily   Dosage confirmed as above?Dosage Confirmed Initial call taken by: Marin Roberts RN,  December 13, 2010 10:24 AM    New/Updated Medications: BD ECLIPSE SYRINGE 23G X 1" 3 ML  MISC (SYRINGE/NEEDLE (DISP)) Use to inject B 12 as ordered monthly Prescriptions: BD ECLIPSE SYRINGE 23G X 1" 3 ML  MISC (SYRINGE/NEEDLE (DISP)) Use to inject B 12 as ordered monthly  #12 x 4   Entered and Authorized by:   Ulyess Mort MD   Signed by:   Ulyess Mort MD on 12/13/2010   Method used:   Electronically to        [facility] [phone] * (retail)       [address]       [place], [place]  [address]       Ph: [phone]       Fax: [phone]   RxID:   [id number] PROTONIX 20 MG TBEC (PANTOPRAZOLE SODIUM) Take 1 tablet by mouth two times a day  #60 x 3   Entered by:   Donia Guiles MD   Authorized by:   Laren Everts MD   Signed by:   Donia Guiles MD on 12/13/2010   Method used:   Electronically to        [facility] [phone] * (retail)       [address]       [place], [place]  [address]       Ph: [phone]       Fax: [phone]   RxID:   [id number] LIPITOR 80 MG TABS (ATORVASTATIN CALCIUM) Take 1 tablet by mouth once a day  #30 x 6   Entered by:   Donia Guiles MD   Authorized by:   Laren Everts MD   Signed by:   Donia Guiles MD on 12/13/2010   Method used:   Electronically to        [facility] [phone] * (retail)       [address]       [place], [place]  [address]       Ph: [phone]    Fax: [phone]   RxID:   [id number] CYANOCOBALAMIN 1000 MCG/ML SOLN (CYANOCOBALAMIN) Inject into the muscle of shoulder or leg once a month  #5 x 2   Entered by:   Donia Guiles MD   Authorized by:   Laren Everts MD   Signed by:   Donia Guiles MD on 12/13/2010   Method used:   Electronically to        [facility] [phone] * (retail)       [address]       [place], [place]  [address]       Ph: [phone]       Fax: [phone]   RxID:   [id number] SUSTIVA 600 MG TABS (EFAVIRENZ) Take one tablet by mouth once daily  #31 Tablet x 3   Entered by:  Donia Guiles MD   Authorized by:   Laren Everts MD   Signed by:   Donia Guiles MD on 12/13/2010   Method used:   Electronically to        [facility] [phone] * (retail)       [address]       [place], [place]  [address]       Ph: [phone]       Fax: [phone]   RxID:   [id number] COMBIVIR 150-300 MG TABS (LAMIVUDINE-ZIDOVUDINE) Take one tablet by mouth two times a day  #62 Tablet x 3   Entered by:   Donia Guiles MD   Authorized by:   Laren Everts MD   Signed by:   Donia Guiles MD on 12/13/2010   Method used:   Electronically to        [facility] [phone] * (retail)       [address]       [place], [place]  [address]       Ph: [phone]       Fax: [phone]   RxID:   [id number]

## 2011-01-31 LAB — T-HELPER CELLS (CD4) COUNT (NOT AT ARMC)
CD4 % Helper T Cell: 28 % — ABNORMAL LOW (ref 33–55)
CD4 T Cell Abs: 790 uL (ref 400–2700)

## 2011-02-19 LAB — CBC
Hemoglobin: 14.4 g/dL (ref 13.0–17.0)
MCHC: 34.4 g/dL (ref 30.0–36.0)
Platelets: 182 10*3/uL (ref 150–400)
RDW: 14.1 % (ref 11.5–15.5)

## 2011-02-19 LAB — DIFFERENTIAL
Basophils Absolute: 0 10*3/uL (ref 0.0–0.1)
Eosinophils Absolute: 0.1 10*3/uL (ref 0.0–0.7)
Lymphocytes Relative: 25 % (ref 12–46)
Monocytes Relative: 6 % (ref 3–12)
Neutro Abs: 7.1 10*3/uL (ref 1.7–7.7)
Neutrophils Relative %: 68 % (ref 43–77)

## 2011-02-19 LAB — PROTIME-INR
INR: 0.92 (ref 0.00–1.49)
Prothrombin Time: 12.3 seconds (ref 11.6–15.2)

## 2011-03-17 ENCOUNTER — Other Ambulatory Visit: Payer: Self-pay | Admitting: Internal Medicine

## 2011-03-27 ENCOUNTER — Encounter: Payer: Self-pay | Admitting: Internal Medicine

## 2011-04-04 NOTE — Op Note (Signed)
[place]. [facility]  Patient:    Paul Saunders, Paul Saunders                         MRN: [id number] Proc. Date: 12/15/00 Adm. Date:  [id number] Attending:  Drema Pry CC:         Dineen Kid. Reche Dixon, M.D.   Operative Report  PREOPERATIVE DIAGNOSIS:  Closed right patellar tendon rupture.  POSTOPERATIVE DIAGNOSIS:  Closed right patellar tendon rupture.  OPERATION PERFORMED:  Repair of right knee patellar tendon.  SURGEON:  Jearld Adjutant, M.D.  ASSISTANT:  Eston Esters, PA-C  ANESTHESIA:  General endotracheal.  CULTURES:  None.  DRAINS:  None.  ESTIMATED BLOOD LOSS:  Less than 100 cc.  FLUID REPLACEMENT:  Without.  TOURNIQUET TIME:  50 minutes.  PATHOLOGIC FINDINGS AND HISTORY:  Mr. Mounger is a 50 year old male who is HIV positive under control with triple drugs.  He was playing basketball yesterday sustaining his injury.  No fracture was associated.  At surgery, there were some fibers anteriorly that we used to tack back over  the repaired tendon more posteriorly to the bone through drill holes using seven #2 Ethibonds. horizontal mattress sutures through the tendon to the bone, then back to the tendon, sutured down and then the flap of remaining patellar tendon pulled overtop and sewed down with interrupted and running #1 Vicryl.  The medial and lateral retinaculum were closed with interrupted figure-of-eight #1 Vicryl. The knee was repaired in slight hyperextension with good repair obtained. There was no evidence of damage within the joint itself.  This was irrigated.  DESCRIPTION OF PROCEDURE:  With adequate anesthesia obtained using endotracheal technique, 1 gm Ancef given IV prophylaxis, the patient was placed in supine position.  The right lower extremity was prepped from the malleoli to the tourniquet in standard fashion.  After standard prepping and draping, Esmarch exsanguination was used and the tourniquet was let up to 350 mmHg.  A median  parapatellar skin incision was then made and dissection carried down to the torn patellar tendon.  The edges were freshened off the bone inferiorly especially.  The joint was opened through the defect and thoroughly irrigated with saline.  I then placed a row of drill holes across the inferior pole of the patella in a line and then using the adjacent hole for each adjacent stitch and a common hole between, I placed six horizontal mattress sutures of #2 Ethibond through-and-through from the tendon side through the bone and back and then tightened it up and tied them down and then pulled the flap of remaining patellar tendon over top to cover the knots and then sutured that down in V-Y fashion with running and interrupted 0 Vicryl. I then closed the retinaculum medially and laterally with interrupted and running 0 Vicryl and some figure-of-eights. When I was satisfied with the repair, irrigation was carried out.  The wound was closed in layers with a running 2-0 Vicryl, 3-0 Vicryl in the superficial subcu, superiorly and skin staples.  Bulky sterile compressive dressing was applied with a cylinder cast, well padded in full extension, molded just above the knee to prevent slippage inferiorly. The patient having tolerated the procedure well was awakened and taken to the recovery room in satisfactory condition for routine postoperative care, follow-up by Dr. Donia Guiles, his medical doctor for HIV and for his HIV drugs. DD:  12/15/00 TD:  12/15/00 Job: [id number]

## 2011-04-04 NOTE — Discharge Summary (Signed)
[place]. [facility]  Patient:    Paul Saunders, Paul Saunders                         MRN: [id number] Adm. Date:  [id number] Disc. Date: [id number] Attending:  Drema Pry Dictator:   Vilinda Blanks. Moye, P.A.-C. CC:         Dineen Kid. Reche Dixon, M.D.   Discharge Summary  DIAGNOSES: 1. Right patellar tendon rupture. 2. History of human immunodeficiency virus positive.  PROCEDURES:  Repair of right knee patellar tendon on December 15, 2000, by Jearld Adjutant, M.D.  MEDICATIONS AT DISCHARGE: 1. Keflex 500 mg t.i.d. x 10 days. 2. Lovenox 30 mg b.i.d. x 14 days. 3. Tylox as needed for pain.  ACTIVITY:  Walking with walker and crutches with weightbearing as tolerated.  FOLLOW-UP:  Follow-up appointments with Dineen Kid. Reche Dixon, M.D., and with Jearld Adjutant, M.D.  HISTORY OF PRESENT ILLNESS:  Paul Saunders is a 50 year old male who is HIV positive, under control with triple drugs.  He was playing basketball on the day prior to admission, sustaining this injury.  No fracture was associated.  HOSPITAL COURSE:  He was admitted and underwent procedure as above.  He was managed medically by Dineen Kid. Reche Dixon, M.D., and the teaching service while inpatient.  Postoperatively, he was placed in a cylinder cast, well padded, in full extension, molded just above the knee to prevent slippage inferiorly.  He remained in house postoperatively for pain control and medical management.  He was discharged home on postoperative day #2 with medications and instructions above. DD:  01/30/01 TD:  01/31/01 Job: [id number]

## 2011-04-17 ENCOUNTER — Other Ambulatory Visit: Payer: Self-pay | Admitting: Internal Medicine

## 2011-07-14 ENCOUNTER — Other Ambulatory Visit: Payer: Self-pay | Admitting: Internal Medicine

## 2011-07-14 DIAGNOSIS — E785 Hyperlipidemia, unspecified: Secondary | ICD-10-CM

## 2011-07-14 NOTE — Telephone Encounter (Signed)
Paul Saunders has not been seen in [facility] for quite some time.  Will refill once, but further refills will require that he be seen in the clinic.

## 2011-07-17 ENCOUNTER — Other Ambulatory Visit: Payer: Self-pay | Admitting: Internal Medicine

## 2011-08-15 LAB — T-HELPER CELLS (CD4) COUNT (NOT AT ARMC): CD4 T Cell Abs: 820

## 2011-08-17 ENCOUNTER — Other Ambulatory Visit: Payer: Self-pay | Admitting: Internal Medicine

## 2011-08-18 ENCOUNTER — Other Ambulatory Visit: Payer: Self-pay | Admitting: *Deleted

## 2011-08-18 MED ORDER — CYANOCOBALAMIN 1000 MCG/ML IJ SOLN: 1000.0000 ug | INTRAMUSCULAR | Status: DC

## 2011-08-25 ENCOUNTER — Other Ambulatory Visit: Payer: Self-pay | Admitting: *Deleted

## 2011-08-25 NOTE — Telephone Encounter (Signed)
Pt's insurance will not pay for twice daily dosing.  Can the Pantoprazole be changed to 40 mg 1 time daily.  Otherwise a prior Authorization will need to be done.

## 2011-08-25 NOTE — Telephone Encounter (Signed)
Does pt need refill on efavirenz or pantoprazole?

## 2011-08-27 ENCOUNTER — Telehealth: Payer: Self-pay | Admitting: *Deleted

## 2011-08-27 MED ORDER — PANTOPRAZOLE SODIUM 20 MG PO TBEC: 20.0000 mg | DELAYED_RELEASE_TABLET | Freq: Every day | ORAL | Status: DC

## 2011-08-27 NOTE — Telephone Encounter (Signed)
[facility] and states pt is upset he hasn't been able to get his generic protonix due to PA for ins to pay, it is noted pt has not been seen in over 1 year so to tell the prior auth rep that he has been evaluated recently is not possible, pt has appt 11/2. Pharm also states that the ins will probably pay for 40mg  1time daily.  Please advise... Wait for appt? Or change instructions?

## 2011-08-27 NOTE — Telephone Encounter (Signed)
I filled for 2 months. He must be seen to receive additional refills.

## 2011-08-27 NOTE — Telephone Encounter (Signed)
Appointment scheduled for 1/12.

## 2011-09-01 ENCOUNTER — Other Ambulatory Visit: Payer: Self-pay | Admitting: Internal Medicine

## 2011-09-01 MED ORDER — PANTOPRAZOLE SODIUM 20 MG PO TBEC: 20.0000 mg | DELAYED_RELEASE_TABLET | Freq: Every day | ORAL | Status: DC

## 2011-09-01 MED ORDER — EFAVIRENZ 600 MG PO TABS: 600.0000 mg | ORAL_TABLET | Freq: Every day | ORAL | Status: DC

## 2011-09-01 NOTE — Telephone Encounter (Signed)
Which med does pt need?

## 2011-09-15 ENCOUNTER — Other Ambulatory Visit: Payer: Self-pay | Admitting: Internal Medicine

## 2011-09-16 ENCOUNTER — Other Ambulatory Visit: Payer: Self-pay | Admitting: Internal Medicine

## 2011-09-19 ENCOUNTER — Encounter: Payer: Self-pay | Admitting: Internal Medicine

## 2011-09-19 ENCOUNTER — Ambulatory Visit (INDEPENDENT_AMBULATORY_CARE_PROVIDER_SITE_OTHER): Payer: Self-pay | Admitting: Internal Medicine

## 2011-09-19 DIAGNOSIS — Z23 Encounter for immunization: Secondary | ICD-10-CM

## 2011-09-19 DIAGNOSIS — E785 Hyperlipidemia, unspecified: Secondary | ICD-10-CM

## 2011-09-19 DIAGNOSIS — E538 Deficiency of other specified B group vitamins: Secondary | ICD-10-CM

## 2011-09-19 DIAGNOSIS — B20 Human immunodeficiency virus [HIV] disease: Secondary | ICD-10-CM

## 2011-09-19 DIAGNOSIS — K219 Gastro-esophageal reflux disease without esophagitis: Secondary | ICD-10-CM

## 2011-09-19 LAB — LIPID PANEL
Cholesterol: 203 mg/dL — ABNORMAL HIGH (ref 0–200)
HDL: 53 mg/dL (ref >39)
Total CHOL/HDL Ratio: 3.8 Ratio
Triglycerides: 274 mg/dL — ABNORMAL HIGH (ref <150)
VLDL: 55 mg/dL — ABNORMAL HIGH (ref 0–40)

## 2011-09-19 LAB — CD4/CD8 (T-HELPER/T-SUPPRESSOR CELL)
CD4 absolute: 910 /uL (ref 500–1900)
CD4%: 28 % — ABNORMAL LOW (ref 30.0–60.0)
CD8 T Cell Abs: 1710 /uL — ABNORMAL HIGH (ref 230–1000)
CD8tox: 53 % — ABNORMAL HIGH (ref 15.0–40.0)
Ratio: 0.53 — ABNORMAL LOW (ref 1.0–3.0)
Total lymphocyte count: 3260 /uL (ref 1000–4000)

## 2011-09-19 LAB — COMPLETE METABOLIC PANEL WITH GFR
ALT: 30 U/L (ref 0–53)
AST: 31 U/L (ref 0–37)
Albumin: 5 g/dL (ref 3.5–5.2)
Alkaline Phosphatase: 79 U/L (ref 39–117)
BUN: 11 mg/dL (ref 6–23)
CO2: 24 mEq/L (ref 19–32)
Calcium: 10 mg/dL (ref 8.4–10.5)
Chloride: 107 mEq/L (ref 96–112)
Creat: 0.74 mg/dL (ref 0.50–1.35)
GFR, Est African American: >89 mL/min (ref >89)
GFR, Est Non African American: >89 mL/min (ref >89)
Glucose, Bld: 90 mg/dL (ref 70–99)
Potassium: 3.7 mEq/L (ref 3.5–5.3)
Sodium: 141 mEq/L (ref 135–145)
Total Bilirubin: 0.4 mg/dL (ref 0.3–1.2)
Total Protein: 8.3 g/dL (ref 6.0–8.3)

## 2011-09-19 MED ORDER — EFAVIRENZ 600 MG PO TABS: 600.0000 mg | ORAL_TABLET | Freq: Every day | ORAL | Status: DC

## 2011-09-19 MED ORDER — ATORVASTATIN CALCIUM 80 MG PO TABS: 80.0000 mg | ORAL_TABLET | Freq: Every day | ORAL | Status: DC

## 2011-09-19 MED ORDER — CYANOCOBALAMIN 1000 MCG/ML IJ SOLN: 1000.0000 ug | INTRAMUSCULAR | Status: DC

## 2011-09-19 MED ORDER — LAMIVUDINE-ZIDOVUDINE 150-300 MG PO TABS: 1.0000 | ORAL_TABLET | Freq: Two times a day (BID) | ORAL | Status: DC

## 2011-09-19 MED ORDER — PANTOPRAZOLE SODIUM 40 MG PO TBEC: 40.0000 mg | DELAYED_RELEASE_TABLET | Freq: Every day | ORAL | Status: DC

## 2011-09-19 NOTE — Progress Notes (Signed)
  Subjective:    Patient ID: Paul Saunders, male    DOB: [birth year], 50 y.o.   MRN: [id number]  HPI Paul Saunders is 50 yo with PMH for HIV, hyperlipidemia, GERD, B12 deficiency and chronic diarrhea who presents for f/u and med refill.  He reports no problems at this time other than persistent heartburn because he has run out of his Protonix.  His last CD4 was and viral load was.  He was seen by Dr. Sampson Goon and Daiva Eves in the [facility] but not in the ID clinic.   Review of Systems  Constitutional: Negative for fever, chills, diaphoresis, appetite change, fatigue and unexpected weight change.  HENT: Negative.   Eyes: Negative.   Respiratory: Negative for chest tightness and shortness of breath.   Cardiovascular: Negative for chest pain and leg swelling.  Genitourinary: Negative for dysuria and difficulty urinating.  Musculoskeletal: Positive for back pain.       Upper back feels strained when driving for prolonged periods, had negative CT of the cervical spine in [place] (late 2011)  Neurological: Negative for syncope, weakness and light-headedness.  Psychiatric/Behavioral: Negative for dysphoric mood and agitation.       Objective:   Physical Exam  Constitutional: He is oriented to person, place, and time. He appears well-developed and well-nourished. No distress.  HENT:  Head: Normocephalic and atraumatic.  Mouth/Throat: No oropharyngeal exudate.  Eyes: Conjunctivae and EOM are normal. Pupils are equal, round, and reactive to light.  Neck: Normal range of motion. Neck supple.  Cardiovascular: Normal rate, regular rhythm, normal heart sounds and intact distal pulses.   No murmur heard. Pulmonary/Chest: Effort normal and breath sounds normal.  Abdominal: Soft. Bowel sounds are normal. He exhibits no distension and no mass. There is no tenderness.  Musculoskeletal: Normal range of motion. He exhibits no edema and no tenderness.  Neurological: He is alert and oriented to person,  place, and time. He has normal reflexes.  Skin: Skin is warm and dry.  Psychiatric: He has a normal mood and affect.          Assessment & Plan:

## 2011-09-19 NOTE — Assessment & Plan Note (Signed)
Currently on Atorvastatin 80 mg without  Complaints of muscle pains/ tenderness or dysuria. Last Lipid panel chol 221, HDL 56, LDL 122, TG 216.  Plan to check Lipid panl today with [facility].

## 2011-09-19 NOTE — Assessment & Plan Note (Signed)
Currently taking monthly shots.  Plan to check B12 level today.

## 2011-09-19 NOTE — Assessment & Plan Note (Signed)
Reports increased episodes of reflux since running out of his Protonix.  Was taking 20 mg bid per pt report and not 20 qd as noted in Epic.  Will refill for 40mg  qd as pt's insurance will only pay for 30 tabs per month.

## 2011-09-19 NOTE — Patient Instructions (Signed)
It was a pleasure to meet you Mr. Berkley.  Today you received vaccinations against pneumonia, whooping cough and tetanus.  You will have blood work today and an appointment with ID will be scheduled.  [facility] [place].  Please return to see me in 1 year.

## 2011-09-19 NOTE — Assessment & Plan Note (Signed)
No complaints at this time.  Currently on Sustiva and Combivir.  No signs and symptoms of thrush or PCP.  Reports having [facility] in [place] [facility].  Plan to give Pneu vacc and TDap today. Last CD4 =790 (06/2010); T4 Helper 28%.  Will schedule f/u with ID clinic for further management.

## 2011-09-20 LAB — CBC
HCT: 42.4 % (ref 39.0–52.0)
Hemoglobin: 15.4 g/dL (ref 13.0–17.0)
MCH: 40 pg — ABNORMAL HIGH (ref 26.0–34.0)
MCHC: 36.3 g/dL — ABNORMAL HIGH (ref 30.0–36.0)
MCV: 110.1 fL — ABNORMAL HIGH (ref 78.0–100.0)
Platelets: 218 10*3/uL (ref 150–400)
RBC: 3.85 MIL/uL — ABNORMAL LOW (ref 4.22–5.81)
RDW: 13.3 % (ref 11.5–15.5)
WBC: 7 10*3/uL (ref 4.0–10.5)

## 2011-09-23 LAB — HIV-1 RNA QUANT-NO REFLEX-BLD
HIV 1 RNA Quant: 36 copies/mL — ABNORMAL HIGH (ref <20)
HIV-1 RNA Quant, Log: 1.56 {Log} — ABNORMAL HIGH (ref <1.30)

## 2011-10-20 ENCOUNTER — Other Ambulatory Visit: Payer: Self-pay | Admitting: *Deleted

## 2011-10-20 DIAGNOSIS — E785 Hyperlipidemia, unspecified: Secondary | ICD-10-CM

## 2011-10-20 NOTE — Telephone Encounter (Signed)
Last filled 10/29

## 2011-10-23 ENCOUNTER — Ambulatory Visit (INDEPENDENT_AMBULATORY_CARE_PROVIDER_SITE_OTHER): Payer: Medicare Other | Admitting: Internal Medicine

## 2011-10-23 ENCOUNTER — Encounter: Payer: Self-pay | Admitting: Internal Medicine

## 2011-10-23 VITALS — BP 145/93 | HR 97 | Temp 98.2°F | Resp 14 | Ht 72.0 in | Wt 180.8 lb

## 2011-10-23 DIAGNOSIS — B20 Human immunodeficiency virus [HIV] disease: Secondary | ICD-10-CM

## 2011-10-23 DIAGNOSIS — E785 Hyperlipidemia, unspecified: Secondary | ICD-10-CM

## 2011-10-23 MED ORDER — EFAVIRENZ-EMTRICITAB-TENOFOVIR 600-200-300 MG PO TABS: 1.0000 | ORAL_TABLET | Freq: Every day | ORAL | Status: DC

## 2011-10-23 MED ORDER — ATORVASTATIN CALCIUM 80 MG PO TABS: 80.0000 mg | ORAL_TABLET | Freq: Every day | ORAL | Status: DC

## 2011-10-23 NOTE — Assessment & Plan Note (Signed)
The patient's LDL was noted and is less than 100. He has no complaints of muscle aches and his recent AST and ALT are within normal limits. Typically the maximum dose of Lipitor with Sustiva is 40 mg. This is secondary to boosting of the Lipitor level by Sustiva. However at this time, since he has been on this dose of Lipitor for many years, and his LDL is good, there is no indication to decrease his therapy. He is tolerating that well and therefore he can continue a period however if he develops any significant muscle muscle aches or hepatic toxicity, this will be readdressed. She should have an AST and ALT monitored closely.

## 2011-10-23 NOTE — Telephone Encounter (Deleted)
Patient is now followed by PCP Dr. Kristie Cowman; please have pharmacy request refill from Dr. Bosie Clos.

## 2011-10-23 NOTE — Patient Instructions (Signed)
Follow up labs in about 1 month

## 2011-10-23 NOTE — Progress Notes (Signed)
  Subjective:    Patient ID: Paul Saunders, male    DOB: 1961/10/06, 50 y.o.   MRN: [id number]  HPI this patient comes in as a new patient to our clinic. He has a long history of HIV and has been on a regimen of Combivir and Sustiva, by his report, for about 10 years. He has had excellent adherence to the medications during that time and has had no significant side effects to his knowledge of the medications. He has remained undetectable for years and tells me that he's had no significant depression of his CD4 count since starting the medicines. He comes in to establish routine care for his HIV. He does tell me he had a flu shot recently and is otherwise up-to-date on his immunizations. He denies any symptoms of peripheral neuropathy, weight loss or rash. He does complain of chronic diarrhea which has been a problem for many years. He otherwise denies fever, thrush. No recent history of opportunistic infections or social chest infections.    Review of Systems  Constitutional: Negative for fever, chills, appetite change, fatigue and unexpected weight change.  HENT: Negative for sore throat and trouble swallowing.   Respiratory: Negative for cough, shortness of breath, wheezing and stridor.   Cardiovascular: Negative for chest pain, palpitations and leg swelling.  Gastrointestinal: Negative for nausea, abdominal pain, diarrhea and constipation.  Genitourinary: Negative for dysuria, hematuria, discharge, genital sores and penile pain.  Skin: Negative for rash.  Neurological: Negative for light-headedness.  Hematological: Negative for adenopathy.  Psychiatric/Behavioral: Negative for sleep disturbance and dysphoric mood.       Objective:   Physical Exam  Constitutional: He is oriented to person, place, and time. He appears well-developed and well-nourished. No distress.  HENT:  Mouth/Throat: Oropharynx is clear and moist. No oropharyngeal exudate.  Cardiovascular: Normal rate, regular rhythm and  normal heart sounds.  Exam reveals no gallop and no friction rub.   No murmur heard. Pulmonary/Chest: Effort normal and breath sounds normal. No respiratory distress. He has no wheezes. He has no rales.  Abdominal: Soft. Bowel sounds are normal. He exhibits no distension. There is no tenderness. There is no rebound.  Lymphadenopathy:    He has no cervical adenopathy.  Neurological: He is alert and oriented to person, place, and time.  Skin: Skin is warm and dry. No rash noted.  Psychiatric: He has a normal mood and affect. His behavior is normal.          Assessment & Plan:

## 2011-10-23 NOTE — Assessment & Plan Note (Signed)
He comes in to establish care for his HIV. He has been on therapy for many years and has done well. I did discuss with him at length the different options of medications including switching his Combivir to Truvada-based regimen. The advantage of changing his NRTI's is the long-term side effect profile is better with tenofovir and emtricitabine. I discussed this with the patient he did opt to change to therapy and in fact will change to one pill daily Atripla. He will take his Combivir as previously prescribed today and start the Atripla tomorrow. I did remind the patient of the need to use condoms with all sexual activity. I will recheck his yearly RPR next visit. I will recheck his CD4 and viral load in about one month after changing therapy to assure that there are no issues.

## 2011-11-20 ENCOUNTER — Other Ambulatory Visit: Payer: Self-pay | Admitting: Internal Medicine

## 2011-11-20 ENCOUNTER — Other Ambulatory Visit (INDEPENDENT_AMBULATORY_CARE_PROVIDER_SITE_OTHER): Payer: Medicare Other

## 2011-11-20 DIAGNOSIS — B20 Human immunodeficiency virus [HIV] disease: Secondary | ICD-10-CM

## 2011-11-24 LAB — HIV-1 RNA QUANT-NO REFLEX-BLD: HIV 1 RNA Quant: <20 copies/mL (ref <20)

## 2011-12-04 ENCOUNTER — Encounter: Payer: Self-pay | Admitting: Internal Medicine

## 2011-12-04 ENCOUNTER — Ambulatory Visit (INDEPENDENT_AMBULATORY_CARE_PROVIDER_SITE_OTHER): Payer: Medicare Other | Admitting: Internal Medicine

## 2011-12-04 DIAGNOSIS — E785 Hyperlipidemia, unspecified: Secondary | ICD-10-CM

## 2011-12-04 DIAGNOSIS — Z113 Encounter for screening for infections with a predominantly sexual mode of transmission: Secondary | ICD-10-CM

## 2011-12-04 DIAGNOSIS — B20 Human immunodeficiency virus [HIV] disease: Secondary | ICD-10-CM

## 2011-12-04 NOTE — Patient Instructions (Signed)
Follow up in 6 months 

## 2011-12-05 ENCOUNTER — Encounter: Payer: Self-pay | Admitting: Internal Medicine

## 2011-12-05 NOTE — Assessment & Plan Note (Signed)
He continues on a high dose of Lipitor but is having no muscle complaints and LFTs are ok.  No dose adjustment at this time.

## 2011-12-05 NOTE — Progress Notes (Signed)
  Subjective:    Patient ID: Paul Saunders, male    DOB: 01-23-1961, 51 y.o.   MRN: [id number]  HPI He comes in for follow up after changing his regimen to Atripla.  He was previously on Sustiva and Combivir but I changed him for ease of dosing and to reduce long term side effects.  He has had no issues and his CD4 is over 1000 and viral load is undetectable.  He has no complaints today.    Review of Systems  Constitutional: Negative for fever, activity change, appetite change, fatigue and unexpected weight change.  HENT: Negative for sore throat and trouble swallowing.   Respiratory: Negative for cough and shortness of breath.   Cardiovascular: Negative for chest pain and leg swelling.  Gastrointestinal: Negative for nausea, vomiting, abdominal pain and diarrhea.  Genitourinary: Negative for discharge and genital sores.  Musculoskeletal: Negative for myalgias and arthralgias.  Skin: Negative for rash.       Complaint of fat pad over his cervical region.   Neurological: Negative for dizziness, light-headedness and headaches.  Hematological: Negative for adenopathy.  Psychiatric/Behavioral: Negative for dysphoric mood. The patient is not nervous/anxious.        Objective:   Physical Exam  Constitutional: He is oriented to person, place, and time. He appears well-developed and well-nourished. No distress.  HENT:  Mouth/Throat: Oropharynx is clear and moist. No oropharyngeal exudate.       + fat pad over cervical area  Cardiovascular: Normal rate, regular rhythm and normal heart sounds.  Exam reveals no gallop and no friction rub.   No murmur heard. Pulmonary/Chest: Effort normal and breath sounds normal. No respiratory distress. He has no wheezes. He has no rales.  Abdominal: Soft. Bowel sounds are normal. He exhibits no distension. There is no tenderness. There is no rebound.  Lymphadenopathy:    He has no cervical adenopathy.  Neurological: He is alert and oriented to person, place,  and time.  Skin: Skin is warm and dry. No rash noted. No erythema.  Psychiatric: His behavior is normal.       Flat affect          Assessment & Plan:

## 2011-12-05 NOTE — Assessment & Plan Note (Addendum)
He is doing well with Atripla and so will continue.  He can follow up in 6 months, sooner if needed.  I reminded him to use condoms with all sexual activity.  His vaccines are up to date.  RPR next visit.   The fat pad may be related to Combivir or HIV.  He has had a CT scan and nothing pathologic noted.

## 2012-01-22 ENCOUNTER — Telehealth: Payer: Self-pay | Admitting: Licensed Clinical Social Worker

## 2012-01-22 NOTE — Telephone Encounter (Signed)
Patient called requesting a note to excuse him from [employer] because he has chronic diarrhea. Is it ok to write the note?

## 2012-01-22 NOTE — Telephone Encounter (Signed)
ok 

## 2012-01-23 ENCOUNTER — Encounter: Payer: Self-pay | Admitting: Licensed Clinical Social Worker

## 2012-03-15 ENCOUNTER — Other Ambulatory Visit: Payer: Self-pay | Admitting: *Deleted

## 2012-03-15 DIAGNOSIS — E785 Hyperlipidemia, unspecified: Secondary | ICD-10-CM

## 2012-03-15 NOTE — Telephone Encounter (Signed)
Requesting 90 days supply 

## 2012-03-16 ENCOUNTER — Other Ambulatory Visit: Payer: Self-pay | Admitting: *Deleted

## 2012-03-16 DIAGNOSIS — K219 Gastro-esophageal reflux disease without esophagitis: Secondary | ICD-10-CM

## 2012-03-16 MED ORDER — PANTOPRAZOLE SODIUM 40 MG PO TBEC: 40.0000 mg | DELAYED_RELEASE_TABLET | Freq: Every day | ORAL | Status: DC

## 2012-03-16 MED ORDER — ATORVASTATIN CALCIUM 80 MG PO TABS: 80.0000 mg | ORAL_TABLET | Freq: Every day | ORAL | Status: DC

## 2012-03-16 NOTE — Telephone Encounter (Signed)
Pt's insurance co requesting 90 days supply.

## 2012-05-26 ENCOUNTER — Encounter: Payer: Self-pay | Admitting: Internal Medicine

## 2012-05-26 ENCOUNTER — Ambulatory Visit (INDEPENDENT_AMBULATORY_CARE_PROVIDER_SITE_OTHER): Payer: Medicare Other | Admitting: Internal Medicine

## 2012-05-26 VITALS — BP 151/92 | HR 101 | Temp 98.1°F | Ht 72.0 in | Wt 195.0 lb

## 2012-05-26 DIAGNOSIS — R197 Diarrhea, unspecified: Secondary | ICD-10-CM

## 2012-05-26 DIAGNOSIS — E785 Hyperlipidemia, unspecified: Secondary | ICD-10-CM

## 2012-05-26 DIAGNOSIS — K529 Noninfective gastroenteritis and colitis, unspecified: Secondary | ICD-10-CM | POA: Insufficient documentation

## 2012-05-26 DIAGNOSIS — B20 Human immunodeficiency virus [HIV] disease: Secondary | ICD-10-CM

## 2012-05-26 DIAGNOSIS — I10 Essential (primary) hypertension: Secondary | ICD-10-CM

## 2012-05-26 DIAGNOSIS — Z113 Encounter for screening for infections with a predominantly sexual mode of transmission: Secondary | ICD-10-CM

## 2012-05-26 LAB — CBC WITH DIFFERENTIAL/PLATELET
Eosinophils Absolute: 0 10*3/uL (ref 0.0–0.7)
Eosinophils Relative: 0 % (ref 0–5)
HCT: 39.7 % (ref 39.0–52.0)
Lymphocytes Relative: 40 % (ref 12–46)
Lymphs Abs: 2.7 10*3/uL (ref 0.7–4.0)
MCH: 33.6 pg (ref 26.0–34.0)
MCV: 91.5 fL (ref 78.0–100.0)
Monocytes Absolute: 0.4 10*3/uL (ref 0.1–1.0)
Platelets: 202 10*3/uL (ref 150–400)
RBC: 4.34 MIL/uL (ref 4.22–5.81)
WBC: 6.7 10*3/uL (ref 4.0–10.5)

## 2012-05-26 LAB — COMPLETE METABOLIC PANEL WITH GFR
BUN: 9 mg/dL (ref 6–23)
CO2: 23 mEq/L (ref 19–32)
Calcium: 9.3 mg/dL (ref 8.4–10.5)
Chloride: 107 mEq/L (ref 96–112)
Creat: 0.86 mg/dL (ref 0.50–1.35)
GFR, Est African American: >89 mL/min
GFR, Est Non African American: >89 mL/min
Glucose, Bld: 103 mg/dL — ABNORMAL HIGH (ref 70–99)
Total Bilirubin: 0.3 mg/dL (ref 0.3–1.2)

## 2012-05-26 NOTE — Assessment & Plan Note (Signed)
He is on full dose atorvastatin which is high with concominant Atripla but he is tolerating well so will continue.

## 2012-05-26 NOTE — Assessment & Plan Note (Signed)
Ongoing for some time.  No concerning signs.  He is going to discuss with his internist.

## 2012-05-26 NOTE — Assessment & Plan Note (Signed)
Doing well on his current regimen.  Knows to use condoms with all sexual activity. RTC in 6 months.

## 2012-05-26 NOTE — Assessment & Plan Note (Signed)
Elevated.  He is going to follow up with IM asap.

## 2012-05-26 NOTE — Progress Notes (Signed)
  Subjective:    Patient ID: Paul Saunders, male    DOB: [birth year], 51 y.o.   MRN: [id number]  HPI Follow up for his HIV.  On Atripla and no medication side effects.  Changed from an older regimen (Sustiva Combivir) previously.  No issues.  Does complain of loose stools.  Some associated with eating.  Some tenesmus.  No weight loss.  Has been ongoing for 10 years.  No missed Atripla doses.      Review of Systems  Constitutional: Negative for fever, chills, appetite change, fatigue and unexpected weight change.  HENT: Negative for sore throat and trouble swallowing.   Respiratory: Negative for cough and shortness of breath.   Cardiovascular: Negative for chest pain, palpitations and leg swelling.  Gastrointestinal: Positive for diarrhea. Negative for nausea, vomiting, abdominal pain and abdominal distention.  Musculoskeletal: Negative for myalgias, joint swelling and arthralgias.  Skin: Negative for rash.  Neurological: Negative for dizziness, weakness and headaches.  Hematological: Negative for adenopathy.  Psychiatric/Behavioral: Negative for dysphoric mood. The patient is not nervous/anxious.        Objective:   Physical Exam  Constitutional: He appears well-developed and well-nourished. No distress.  HENT:  Mouth/Throat: Oropharynx is clear and moist. No oropharyngeal exudate.  Cardiovascular: Normal rate, regular rhythm and normal heart sounds.  Exam reveals no gallop and no friction rub.   No murmur heard. Pulmonary/Chest: Effort normal and breath sounds normal. No respiratory distress. He has no wheezes. He has no rales.  Abdominal: Soft. Bowel sounds are normal. He exhibits no distension. There is no tenderness. There is no rebound.  Lymphadenopathy:    He has no cervical adenopathy.          Assessment & Plan:

## 2012-05-27 LAB — T-HELPER CELL (CD4) - (RCID CLINIC ONLY)
CD4 % Helper T Cell: 29 % — ABNORMAL LOW (ref 33–55)
CD4 T Cell Abs: 800 uL (ref 400–2700)

## 2012-05-27 LAB — RPR

## 2012-05-29 LAB — HIV-1 RNA QUANT-NO REFLEX-BLD
HIV 1 RNA Quant: <20 copies/mL (ref <20)
HIV-1 RNA Quant, Log: <1.3 {Log} (ref <1.30)

## 2012-06-17 ENCOUNTER — Telehealth: Payer: Self-pay | Admitting: *Deleted

## 2012-06-17 NOTE — Telephone Encounter (Signed)
Patient called c/o cold symptoms.  Returned call and left voicemail advising him to call his PCP and request appointment. Wendall Mola CMA

## 2012-06-21 ENCOUNTER — Telehealth: Payer: Self-pay | Admitting: *Deleted

## 2012-06-21 NOTE — Telephone Encounter (Signed)
Pt calls and states for 3 wks he has episodes of hot/ cold, weak, dizzy and feeling faint, this comes all times of day, nothing happens prior that increases reason for happening, nothing has changed that would possibly cause this, no new meds, no one has been sick around him. He wisshes to be seen before his appt 8/12 and is scheduled 8/6 at 1345 dr Clyde Lundborg per sharonb.

## 2012-06-22 ENCOUNTER — Encounter: Payer: Self-pay | Admitting: Internal Medicine

## 2012-06-22 ENCOUNTER — Ambulatory Visit (INDEPENDENT_AMBULATORY_CARE_PROVIDER_SITE_OTHER): Payer: Medicare Other | Admitting: Internal Medicine

## 2012-06-22 VITALS — BP 126/89 | HR 85 | Temp 99.1°F | Resp 20

## 2012-06-22 DIAGNOSIS — R531 Weakness: Secondary | ICD-10-CM | POA: Insufficient documentation

## 2012-06-22 DIAGNOSIS — R5383 Other fatigue: Secondary | ICD-10-CM

## 2012-06-22 DIAGNOSIS — E785 Hyperlipidemia, unspecified: Secondary | ICD-10-CM

## 2012-06-22 DIAGNOSIS — R5381 Other malaise: Secondary | ICD-10-CM

## 2012-06-22 DIAGNOSIS — B20 Human immunodeficiency virus [HIV] disease: Secondary | ICD-10-CM

## 2012-06-22 DIAGNOSIS — E538 Deficiency of other specified B group vitamins: Secondary | ICD-10-CM

## 2012-06-22 LAB — TSH: TSH: 2.598 u[IU]/mL (ref 0.350–4.500)

## 2012-06-22 NOTE — Assessment & Plan Note (Addendum)
Patient's symptoms are most likely caused by depression. Per patient, he has negative workup for chest x-ray and CT head in his recent visit to ED. He does not have focal neurologic signs. His physical examination is completely normal. The other possibility includes, thyroid dysfunction, vitamin B12 deficiency and substance abuse.   -Will get record from [facility].  Patient already signed consent. -Will check TSH, BMP, vitamin B12, folate level and UDS. -Patient is advised to cut down on his drinking. Currently he is drinking 4-5 beers every day. -Patient is advised to quit marijuana use. -Patient denies having depression and refused a referral to psychiatry.

## 2012-06-22 NOTE — Assessment & Plan Note (Signed)
His last LDL was 95 at 09/19/11. He is currently taking Lipitor. His AST, ALT were normal at 05/26/12. We'll continue current regimen.

## 2012-06-22 NOTE — Patient Instructions (Signed)
1.  I will do some blood work up for you. Please come back in 3 weeks. At the same time, we are trying to get your medical record from your previous ED visit. 2. Please take all medications as prescribed.  3. If you have worsening of your symptoms or new symptoms arise, please call the clinic ([phone]), or go to the ER immediately if symptoms are severe. 4. If you feel like you are going to kill yourself or others, please come back to emergency room immediately.

## 2012-06-22 NOTE — Assessment & Plan Note (Signed)
It is well controlled. Patient has been followed up with ID clinic. His recent VL was less than 20 and CD4 was 800. Patient is currently taking Atripla. We'll continue current regimen.

## 2012-06-22 NOTE — Progress Notes (Signed)
Patient ID: Paul Saunders, male   DOB: 08-Jun-1961, 51 y.o.   MRN: [id number]  Subjective:   Patient ID: Paul Saunders male   DOB: [birth year] 51 y.o.   MRN: [id number]  HPI: [facility] is [facility] 51 [facility]. with past medical history as outlined below, who presents for an ED followup visit.   Patient recently visited the ED in [facility] at [facility] due to generalized weakness and tingling sensation in his arms. Patient reports that he started feeling hot, tingling in his arms bilaterally, shortness of breath, generalized weakness at May 22, 2012. The symptoms lasted for several hours at the beginning and then resolved by itself. But 2 to 3 weeks later, the same symptoms happened again and have been progressively getting worse. He visited ED at [facility] at [facility]. He was evaluated at ED with negative findings on CXR and CT-head per patient (we do not have record yet). He denies chest pain, PND, orthopnea, leg edema, cough, unilateral weakness or decreased sensations in his extremities.   Patient also reports having symptoms for depression, including loss of interest in his daily activity, feeling guilty for his having HIV, lack of energy, difficulty in concentration and decreased appetite. He had suicidal and homicidal ideation occasionally. Currently he does not have suicidal or homicidal ideation. He did not have plan for suicidal or homicidal ideation. Patient denies having depression.  Patient reports that he has been drinking 4-5 beers every day for 30 years. He also uses marijuana almost every day.  Denies headaches,  abdominal pain,diarrhea, constipation, dysuria, urgency, frequency, hematuria, joint pain or leg swelling.   Past Medical History  Diagnosis Date  . HIV infection   . Hyperlipidemia   . Vitamin B 12 deficiency   . GERD (gastroesophageal reflux disease)   . Oral thrush     Hx of  thrush  . PCP (pneumocystis carinii pneumonia)     Hx of PCP  . Patellar tendon rupture     hx of patellar tendon rupture  . Functional diarrhea     negative serologic/stool s/u refuses colonoscopy   Current Outpatient Prescriptions  Medication Sig Dispense Refill  . atorvastatin (LIPITOR) 80 MG tablet Take 1 tablet (80 mg total) by mouth daily.  90 tablet  6  . cyanocobalamin (,VITAMIN B-12,) 1000 MCG/ML injection Inject 1 mL (1,000 mcg total) into the muscle every 30 (thirty) days.  10 mL  3  . efavirenz-emtrictabine-tenofovir (ATRIPLA) 600-200-300 MG per tablet Take 1 tablet by mouth daily.  30 tablet  11  . pantoprazole (PROTONIX) 40 MG tablet Take 1 tablet (40 mg total) by mouth daily.  90 tablet  3  . SYRINGE-NEEDLE, DISP, 3 ML (BD ECLIPSE SYRINGE) 23G X 1" 3 ML MISC by Does not apply route. Dispense as needed.        Family History  Problem Relation Age of Onset  . Kidney disease Neg Hx    History   Social History  . Marital Status: Single    Spouse Name: N/A    Number of Children: N/A  . Years of Education: N/A   Social History Main Topics  . Smoking status: Former Smoker    Quit date: 09/19/1991  . Smokeless tobacco: Never Used  . Alcohol Use: Yes     socially  . Drug Use: No  . Sexually Active: Yes     pt. given condoms   Other Topics Concern  .  None   Social History Narrative  . None   Review of Systems: General: no fevers, chills, has decreased energy level and appetite.  Skin: no rash HEENT: no blurry vision, hearing changes or sore throat Pulm: no dyspnea, coughing, wheezing CV: no chest pain, palpitations, shortness of breath Abd: no nausea/vomiting, abdominal pain, diarrhea/constipation GU: no dysuria, hematuria, polyuria Ext: no arthralgias, myalgias. Has tingling sensation in arms. Neuro: no weakness, numbness, or tingling   Objective:  Physical Exam:  Vitals: T: 99.1      HR:85       BP: 126/89        General: looks in depressed mood.  Talks in a very soft tone. Answers questions slowly. HEENT: PERRL, EOMI, no scleral icterus Cardiac: S1/S2, RRR, No murmurs, gallops or rubs Pulm: Good air movement bilaterally, Clear to auscultation bilaterally, No rales, wheezing, rhonchi or rubs. Abd: Soft,  nondistended, nontender, no rebound pain, no organomegaly, BS present Ext: No rashes or edema, 2+DP/PT pulse bilaterally Musculoskeletal: No joint deformities, erythema, or stiffness, ROM full and nontender Skin: no rashes. No skin bruise. Neuro: alert and oriented X3, cranial nerves II-XII grossly intact, muscle strength 5/5 in all extremeties,  sensation to light touch intact.  Psych.: currently no suicidal or hemocidal ideation.   Assessment & Plan:

## 2012-06-23 LAB — BASIC METABOLIC PANEL WITH GFR
BUN: 11 mg/dL (ref 6–23)
Calcium: 9.8 mg/dL (ref 8.4–10.5)
Chloride: 109 mEq/L (ref 96–112)
Creat: 0.87 mg/dL (ref 0.50–1.35)
GFR, Est African American: >89 mL/min
GFR, Est Non African American: >89 mL/min
Sodium: 143 mEq/L (ref 135–145)

## 2012-06-23 LAB — PRESCRIPTION ABUSE MONITORING 15P, URINE
Barbiturate Screen, Urine: NEGATIVE ng/mL
Cocaine Metabolites: NEGATIVE ng/mL
Creatinine, Urine: 174.01 mg/dL (ref >20.0)
Meperidine, Ur: NEGATIVE ng/mL
Methadone Screen, Urine: NEGATIVE ng/mL
Opiate Screen, Urine: NEGATIVE ng/mL
Oxycodone Screen, Ur: NEGATIVE ng/mL
Propoxyphene: NEGATIVE ng/mL

## 2012-06-25 LAB — CANNABANOIDS (GC/LC/MS), URINE: THC-COOH (GC/LC/MS), ur confirm: 135 ng/mL

## 2012-06-28 ENCOUNTER — Encounter: Payer: Medicare Other | Admitting: Internal Medicine

## 2012-07-12 ENCOUNTER — Ambulatory Visit (INDEPENDENT_AMBULATORY_CARE_PROVIDER_SITE_OTHER): Payer: Medicare Other | Admitting: Internal Medicine

## 2012-07-12 ENCOUNTER — Encounter: Payer: Self-pay | Admitting: Internal Medicine

## 2012-07-12 VITALS — BP 136/92 | HR 69 | Temp 98.0°F | Ht 72.0 in | Wt 180.0 lb

## 2012-07-12 DIAGNOSIS — R5381 Other malaise: Secondary | ICD-10-CM

## 2012-07-12 DIAGNOSIS — Z0000 Encounter for general adult medical examination without abnormal findings: Secondary | ICD-10-CM

## 2012-07-12 DIAGNOSIS — R197 Diarrhea, unspecified: Secondary | ICD-10-CM

## 2012-07-12 DIAGNOSIS — R5383 Other fatigue: Secondary | ICD-10-CM

## 2012-07-12 DIAGNOSIS — I10 Essential (primary) hypertension: Secondary | ICD-10-CM

## 2012-07-12 DIAGNOSIS — E538 Deficiency of other specified B group vitamins: Secondary | ICD-10-CM

## 2012-07-12 DIAGNOSIS — B20 Human immunodeficiency virus [HIV] disease: Secondary | ICD-10-CM

## 2012-07-12 DIAGNOSIS — R531 Weakness: Secondary | ICD-10-CM

## 2012-07-12 DIAGNOSIS — K529 Noninfective gastroenteritis and colitis, unspecified: Secondary | ICD-10-CM

## 2012-07-12 NOTE — Assessment & Plan Note (Signed)
For nearly a decade, controlled with Pepto-Bismol, no weight loss, usually associated with food, some tenesmus, denies blood or mucus. Will have pt addressed this with GI on consultation for screening colonoscopy

## 2012-07-12 NOTE — Progress Notes (Signed)
  Subjective:    Patient ID: Paul Saunders, male    DOB: 12-May-1961, 51 y.o.   MRN: [id number]  HPI 51 yo with well controlled HIV (last vl <20 and CD 800), chronic diarrhea for years, Vitamin B12 deficiency on injections and recent onset of generalized weakness seen 06/22/12 by Dr. Clyde Lundborg.  Presents today for f/u.  Reports weakness has resolved for the most part from "8-9" to "1-2". Denies chest pain, sob, abdominal pain or depression.  States that he uses a "spoonful" of Pepto-Bismol for his diarrhea which alleviates symptoms for ~3 days.  Otherwise can have anywhere from 3-5 bowel movements a day which are non-bloody and without mucus. He is requesting a referral for screening colonoscopy today.   Review of Systems  Constitutional: Negative for fever, appetite change and unexpected weight change.  Cardiovascular: Negative for chest pain.  Gastrointestinal: Negative for abdominal distention.  Neurological: Positive for weakness. Negative for seizures, light-headedness and headaches.       Improved  Psychiatric/Behavioral: Negative for dysphoric mood.        Objective:   Physical Exam  Constitutional: He is oriented to person, place, and time. He appears well-developed and well-nourished. No distress.  HENT:  Head: Atraumatic.  Eyes: EOM are normal.  Neck: Normal range of motion. Neck supple.  Cardiovascular: Normal rate, regular rhythm and normal heart sounds.   Pulmonary/Chest: Effort normal and breath sounds normal.  Abdominal: Soft. Bowel sounds are normal. There is no tenderness.  Musculoskeletal: Normal range of motion. He exhibits no edema.  Neurological: He is alert and oriented to person, place, and time. He has normal strength. He displays normal reflexes. No cranial nerve deficit. He exhibits normal muscle tone. Coordination normal.  Skin: Skin is warm and dry.  Psychiatric: He has a normal mood and affect. His behavior is normal. Judgment and thought content normal.         Assessment & Plan:  1. Generalized weakness: resolving, TSH, Vit B12 and electrolytes within normal limits 2. HIV: well controlled, cont Atripla and f/u Dr. Luciana Axe of ID 3. Chronic diarrhea: will have pt address this at consultation with GI 4. Preventative care: colonoscopy to be scheduled, no fam hx of colon cancer 5. Hypertension: pt with sbp >150 at prior visits with DR. Comer, bp at goal today and on last visit 06/22/2012 -will defer antihypertensives at this point given bp today of 136/92 and per pt wishes

## 2012-07-12 NOTE — Assessment & Plan Note (Signed)
controlled on Atripla, followed by Dr. Luciana Axe last evaluated 05/2012 CD 800 and VL <20 (July 2013)

## 2012-07-12 NOTE — Patient Instructions (Addendum)
It was nice to see you again, Paul Saunders.  I am glad that you have improvement in your feeling of weakness. Try to stay hydrated by drinking more fluids when you have diarrheal episodes. We will schedule a screening colonoscopy for you with a Gastroenterologist.  Please address you chronic diarrhea with the GI specialist Return to see me in 1 year.  If you are found to have elevated blood pressure at later visits, we will have to re-address starting blood pressure medications.

## 2012-07-12 NOTE — Assessment & Plan Note (Signed)
Appears episodic as he has not been hypertensive in our clinic.  Today 136/92 pulse 69.  Father and siblings with hypertension.  Pt would like to defer medications for now. Agree to defer with advice for pt to be re-evaluated if bp elevated in other clinics.  Filed Vitals:   07/12/12 1328  BP: 136/92  Pulse: 69  Temp: 98 F (36.7 C)  TempSrc: Oral  Height: 6' (1.829 m)  Weight: 180 lb (81.647 kg)  SpO2: 98%

## 2012-07-12 NOTE — Assessment & Plan Note (Signed)
Nearly resolved, etiology unclear given negative work-up but may be related to recent exacerbation of diarrhea.  BMET    Component Value Date/Time   NA 143 06/22/2012 1532   K 3.6 06/22/2012 1532   CL 109 06/22/2012 1532   CO2 20 06/22/2012 1532   GLUCOSE 79 06/22/2012 1532   BUN 11 06/22/2012 1532   CREATININE 0.87 06/22/2012 1532   CREATININE 0.90 06/20/2010 2250   CALCIUM 9.8 06/22/2012 1532   CBC    Component Value Date/Time   WBC 6.7 05/26/2012 1510   RBC 4.34 05/26/2012 1510   HGB 14.6 05/26/2012 1510   HCT 39.7 05/26/2012 1510   PLT 202 05/26/2012 1510   MCV 91.5 05/26/2012 1510   MCH 33.6 05/26/2012 1510   MCHC 36.8* 05/26/2012 1510   RDW 13.7 05/26/2012 1510   LYMPHSABS 2.7 05/26/2012 1510   MONOABS 0.4 05/26/2012 1510   EOSABS 0.0 05/26/2012 1510   BASOSABS 0.0 05/26/2012 1510

## 2012-07-12 NOTE — Assessment & Plan Note (Signed)
Last Vit B12 726, continues weekly Vit B12 injections in [place]

## 2012-08-12 ENCOUNTER — Other Ambulatory Visit: Payer: Self-pay | Admitting: Gastroenterology

## 2012-10-16 ENCOUNTER — Other Ambulatory Visit: Payer: Self-pay | Admitting: Internal Medicine

## 2013-02-23 ENCOUNTER — Encounter: Payer: Self-pay | Admitting: *Deleted

## 2013-03-17 ENCOUNTER — Other Ambulatory Visit: Payer: Self-pay | Admitting: *Deleted

## 2013-03-17 DIAGNOSIS — K219 Gastro-esophageal reflux disease without esophagitis: Secondary | ICD-10-CM

## 2013-03-17 MED ORDER — PANTOPRAZOLE SODIUM 40 MG PO TBEC: 40.0000 mg | DELAYED_RELEASE_TABLET | Freq: Every day | ORAL | Status: DC

## 2013-04-14 ENCOUNTER — Other Ambulatory Visit: Payer: Self-pay | Admitting: Internal Medicine

## 2013-05-09 ENCOUNTER — Encounter: Payer: Self-pay | Admitting: Internal Medicine

## 2013-05-09 ENCOUNTER — Ambulatory Visit (INDEPENDENT_AMBULATORY_CARE_PROVIDER_SITE_OTHER): Payer: Medicare Other | Admitting: Internal Medicine

## 2013-05-09 VITALS — BP 126/85 | HR 70 | Temp 98.2°F | Ht 72.0 in | Wt 188.6 lb

## 2013-05-09 DIAGNOSIS — B20 Human immunodeficiency virus [HIV] disease: Secondary | ICD-10-CM

## 2013-05-09 DIAGNOSIS — K529 Noninfective gastroenteritis and colitis, unspecified: Secondary | ICD-10-CM

## 2013-05-09 DIAGNOSIS — E538 Deficiency of other specified B group vitamins: Secondary | ICD-10-CM

## 2013-05-09 DIAGNOSIS — F410 Panic disorder [episodic paroxysmal anxiety] without agoraphobia: Secondary | ICD-10-CM

## 2013-05-09 DIAGNOSIS — K219 Gastro-esophageal reflux disease without esophagitis: Secondary | ICD-10-CM

## 2013-05-09 DIAGNOSIS — R197 Diarrhea, unspecified: Secondary | ICD-10-CM

## 2013-05-09 DIAGNOSIS — I10 Essential (primary) hypertension: Secondary | ICD-10-CM

## 2013-05-09 DIAGNOSIS — E785 Hyperlipidemia, unspecified: Secondary | ICD-10-CM

## 2013-05-09 MED ORDER — CYANOCOBALAMIN 1000 MCG/ML IJ SOLN: 1000.0000 ug | INTRAMUSCULAR | Status: DC

## 2013-05-09 MED ORDER — PANTOPRAZOLE SODIUM 40 MG PO TBEC: 40.0000 mg | DELAYED_RELEASE_TABLET | Freq: Every day | ORAL | Status: DC

## 2013-05-09 NOTE — Assessment & Plan Note (Signed)
BP Readings from Last 3 Encounters:  05/09/13 126/85  07/12/12 136/92  06/22/12 126/89    Lab Results  Component Value Date   NA 143 06/22/2012   K 3.6 06/22/2012   CREATININE 0.87 06/22/2012    Assessment: Blood pressure control: controlled Progress toward BP goal:  at goal Comments: pt states that he thinks that he takes Atenolol 25 mg twice a day  Plan: Medications:  continue current medications Educational resources provided: brochure Self management tools provided:   Other plans: I will start refilling his Atenolol as he will be getting his care here consistently, pt to have refill requests forwarded to me

## 2013-05-09 NOTE — Progress Notes (Signed)
Subjective:    Patient ID: Paul Saunders, male    DOB: 1961-07-31, 52 y.o.   MRN: [id number]  HPI  [name] is a 52 year old male who presents to clinic for general followup. His history is significant for well-controlled HIV on Atripla followed by Dr. Luciana Axe of Infection Diseases, chronic diarrhea, GERD, vitamin B 12 deficiency, and hypertension. Since last evaluation 2 months ago he has had a colonoscopy with normal findings per [facility]. He reports that his chronic diarrhea is controlled with one daily dose of Alka-Seltzer. At last visit he was using daily Pepto-Bismol. He reports compliance with all meds and he is requesting that I start refilling his vitamin B12 and atenolol which is prescribed by other providers. Of note he gets vitamin B12 injections and atenolol prescription at a medical practice in [place], [place] at [facility]. He reports fleeting daily headaches, some upper abdominal tightening without radiation, denies chest pain, chest tightness, shortness of breath, presyncopal symptoms but does endorse daily anxiety which he states is much better controlled since being placed on Zoloft per psychiatry (Dr. Reginold Agent) in [place], [place] Dec 2013.  Review of Systems  Constitutional: Negative for fever, activity change, fatigue and unexpected weight change.  Eyes: Negative for visual disturbance.  Respiratory: Negative for chest tightness and shortness of breath.   Cardiovascular: Negative for chest pain and leg swelling.  Gastrointestinal: Positive for diarrhea. Negative for nausea, vomiting and blood in stool.  Endocrine: Negative for polyuria.  Musculoskeletal: Negative for back pain.  Skin: Negative for rash.  Allergic/Immunologic: Positive for immunocompromised state.  Neurological: Positive for headaches. Negative for dizziness, syncope, facial asymmetry, speech difficulty, weakness, light-headedness and numbness.  Psychiatric/Behavioral: Negative for  behavioral problems, confusion and dysphoric mood. The patient is nervous/anxious.        Objective:   Physical Exam  Constitutional: He is oriented to person, place, and time. He appears well-developed and well-nourished. No distress.  HENT:  Head: Normocephalic and atraumatic.  Eyes: Conjunctivae and EOM are normal. Pupils are equal, round, and reactive to light.  Neck: Normal range of motion. Neck supple. No thyromegaly present.  Cardiovascular: Normal rate, regular rhythm, normal heart sounds and intact distal pulses.   Pulmonary/Chest: Effort normal and breath sounds normal. He has no wheezes.  Abdominal: Soft. Bowel sounds are normal. He exhibits no distension and no mass. There is no tenderness. There is no rebound and no guarding.  Musculoskeletal: Normal range of motion. He exhibits no edema and no tenderness.  Neurological: He is alert and oriented to person, place, and time. No cranial nerve deficit.  Skin: Skin is warm and dry.  Psychiatric: His speech is normal and behavior is normal. Judgment and thought content normal. His mood appears not anxious. His affect is blunt. Cognition and memory are normal.          Assessment & Plan:  1. Diarrhea, chronic: etiology unclear, no functional cause on colonoscopy, could be related to tenofovir in Atripla and /or chronic anxiety. Improved with Alka-Seltzer which contains aspirin, citric acid, bicarbonate bicarbonate which decreases his bowel movements from ~3-5 per day to once a day. -will continue to monitor and check electrolytes today as well as liver enzymes  2. Anxiety with Panic Disorder: improved and stable on Zoloft, managed per psychiatry  3. HIV: managed per ID  4. Hypertension: at goal on atenolol, pt uncertain of dosage but will have clinic notes forwarded and PCP listed at pharmacy  5. Vit b12 defiency: stable  on supplementation, pt to have prescription refills forwarded to pcp  6. Hyperlipidemia: on high dose  lipitor -check lipid panel

## 2013-05-09 NOTE — Patient Instructions (Addendum)
General Instructions: Your blood pressure is under good control on your current medications. I have added the Zoloft and Atenolol to your chart records.  Please have your other doctors forward their clinic notes. We will check your cholesterol and electrolytes today. You may continue the Alka-Seltzer if this controls your diarrhea the best. Sometimes this may cause stomach cramps. Follow-up with me in 6 months or earlier if needed.    Treatment Goals:  Goals (1 Years of Data) as of 05/09/13         As of Today 07/12/12 06/22/12 05/26/12 12/04/11     Blood Pressure    . Blood Pressure < 140/90  126/85 136/92 126/89 151/92 145/94      Progress Toward Treatment Goals:  Treatment Goal 05/09/2013  Blood pressure at goal    Self Care Goals & Plans:  Self Care Goal 05/09/2013  Manage my medications take my medicines as prescribed; bring my medications to every visit; refill my medications on time  Eat healthy foods drink diet soda or water instead of juice or soda; eat more vegetables; eat foods that are low in salt; eat baked foods instead of fried foods       Care Management & Community Referrals:

## 2013-05-10 LAB — LIPID PANEL
HDL: 42 mg/dL (ref >39)
LDL Cholesterol: 57 mg/dL (ref 0–99)
VLDL: 64 mg/dL — ABNORMAL HIGH (ref 0–40)

## 2013-05-10 LAB — BASIC METABOLIC PANEL
CO2: 21 mEq/L (ref 19–32)
Calcium: 9.5 mg/dL (ref 8.4–10.5)
Sodium: 136 mEq/L (ref 135–145)

## 2013-05-11 NOTE — Progress Notes (Signed)
Case discussed with Dr. Schooler soon after the resident saw the patient.  We reviewed the resident's history and exam and pertinent patient test results.  I agree with the assessment, diagnosis, and plan of care documented in the resident's note. 

## 2013-05-17 ENCOUNTER — Encounter: Payer: Self-pay | Admitting: Internal Medicine

## 2013-05-17 ENCOUNTER — Other Ambulatory Visit (HOSPITAL_COMMUNITY)
Admission: RE | Admit: 2013-05-17 | Discharge: 2013-05-17 | Disposition: A | Discharge status: Home / Self Care | Payer: Medicare Other | Source: Ambulatory Visit | Attending: Internal Medicine | Admitting: Internal Medicine

## 2013-05-17 ENCOUNTER — Ambulatory Visit (INDEPENDENT_AMBULATORY_CARE_PROVIDER_SITE_OTHER): Payer: Medicare Other | Admitting: Internal Medicine

## 2013-05-17 VITALS — BP 119/81 | HR 66 | Temp 98.2°F | Ht 72.0 in | Wt 187.0 lb

## 2013-05-17 DIAGNOSIS — Z113 Encounter for screening for infections with a predominantly sexual mode of transmission: Secondary | ICD-10-CM

## 2013-05-17 DIAGNOSIS — B20 Human immunodeficiency virus [HIV] disease: Secondary | ICD-10-CM

## 2013-05-17 LAB — CBC WITH DIFFERENTIAL/PLATELET
Basophils Absolute: 0 10*3/uL (ref 0.0–0.1)
HCT: 39.8 % (ref 39.0–52.0)
Lymphocytes Relative: 43 % (ref 12–46)
Neutro Abs: 4 10*3/uL (ref 1.7–7.7)
Platelets: 218 10*3/uL (ref 150–400)
RDW: 14.7 % (ref 11.5–15.5)
WBC: 7.8 10*3/uL (ref 4.0–10.5)

## 2013-05-17 MED ORDER — ELVITEG-COBIC-EMTRICIT-TENOFDF 150-150-200-300 MG PO TABS: 1.0000 | ORAL_TABLET | Freq: Every day | ORAL | Status: DC

## 2013-05-18 LAB — COMPLETE METABOLIC PANEL WITH GFR
ALT: 25 U/L (ref 0–53)
Albumin: 4.7 g/dL (ref 3.5–5.2)
CO2: 22 mEq/L (ref 19–32)
Calcium: 9.8 mg/dL (ref 8.4–10.5)
Chloride: 108 mEq/L (ref 96–112)
Creat: 0.92 mg/dL (ref 0.50–1.35)
GFR, Est African American: >89 mL/min
Potassium: 3.4 mEq/L — ABNORMAL LOW (ref 3.5–5.3)
Total Protein: 7.2 g/dL (ref 6.0–8.3)

## 2013-05-18 LAB — T-HELPER CELL (CD4) - (RCID CLINIC ONLY)
CD4 % Helper T Cell: 30 % — ABNORMAL LOW (ref 33–55)
CD4 T Cell Abs: 1010 uL (ref 400–2700)

## 2013-05-18 LAB — HIV-1 RNA QUANT-NO REFLEX-BLD: HIV-1 RNA Quant, Log: <1.3 {Log} (ref <1.30)

## 2013-05-18 NOTE — Progress Notes (Signed)
  Subjective:    Patient ID: Paul Saunders, male    DOB: 1961-01-20, 52 y.o.   MRN: [id number]  HPI He comes in here for routine followup. He has not been seen in about one year. He takes Atripla and reports good compliance. No new issues and he does see internal medicine as well. He does continue to take atorvastatin 80 mg spell is tolerating well with no muscle aches or other issues. He denies any missed doses. No weight loss. Diarrhea is the same.  As continue to struggle with depression. He does see psychiatry.   Review of Systems  Constitutional: Negative for fatigue.  HENT: Negative for sore throat and trouble swallowing.   Respiratory: Negative for shortness of breath.   Cardiovascular: Negative for chest pain.  Gastrointestinal: Positive for diarrhea. Negative for nausea and abdominal pain.  Skin: Negative for rash.  Neurological: Negative for dizziness, light-headedness and headaches.  Hematological: Negative for adenopathy.       Objective:   Physical Exam  Constitutional: He appears well-developed and well-nourished. No distress.  HENT:  Mouth/Throat: No oropharyngeal exudate.  Cardiovascular: Normal rate, regular rhythm and normal heart sounds.   No murmur heard. Pulmonary/Chest: Effort normal and breath sounds normal. No respiratory distress. He has no wheezes.  Skin: No rash noted.          Assessment & Plan:

## 2013-05-18 NOTE — Assessment & Plan Note (Addendum)
He is doing well with Atripla however with his significant depression but he struggles with, I am going to change him to Stribild to see if he has some improvement in his depression off of Atripla. He will refill Stribild when his next medication refill is due and I will see him in about 2 months on the Stribild to see if he had any improvement and to assure he has good viral suppression.

## 2013-06-18 ENCOUNTER — Other Ambulatory Visit: Payer: Self-pay | Admitting: Internal Medicine

## 2013-06-22 ENCOUNTER — Other Ambulatory Visit: Payer: Self-pay | Admitting: Internal Medicine

## 2013-06-22 MED ORDER — ATENOLOL 25 MG PO TABS: 25.0000 mg | ORAL_TABLET | Freq: Every day | ORAL | Status: DC

## 2013-06-22 NOTE — Telephone Encounter (Signed)
Last OV 6/23

## 2013-07-16 ENCOUNTER — Other Ambulatory Visit: Payer: Self-pay | Admitting: Internal Medicine

## 2013-07-21 ENCOUNTER — Ambulatory Visit (INDEPENDENT_AMBULATORY_CARE_PROVIDER_SITE_OTHER): Payer: Medicare Other | Admitting: Internal Medicine

## 2013-07-21 ENCOUNTER — Encounter: Payer: Self-pay | Admitting: Internal Medicine

## 2013-07-21 VITALS — BP 100/67 | HR 76 | Temp 98.2°F | Ht 72.0 in | Wt 189.0 lb

## 2013-07-21 DIAGNOSIS — B20 Human immunodeficiency virus [HIV] disease: Secondary | ICD-10-CM

## 2013-07-21 DIAGNOSIS — F410 Panic disorder [episodic paroxysmal anxiety] without agoraphobia: Secondary | ICD-10-CM

## 2013-07-21 DIAGNOSIS — Z23 Encounter for immunization: Secondary | ICD-10-CM

## 2013-07-21 NOTE — Assessment & Plan Note (Signed)
He is doing well on his new regimen and will continue with this. Will return in 3 months.

## 2013-07-21 NOTE — Assessment & Plan Note (Signed)
No significant changes his account of his anxiety or depression however I will continue him on the Stribild since he is tolerating it.  There may be some benefit that is subtle

## 2013-07-21 NOTE — Progress Notes (Signed)
  Subjective:    Patient ID: Paul Saunders, male    DOB: 09-Aug-1961, 53 y.o.   MRN: [id number]  HPI He comes in for followup. He has a long history of HIV and has been on Atripla I changed him to Stribild last visit due to his persistent depression. He has been on Stribild now for a little over a month and feels well. He has not noted  Any changes in his depression after changing. He does continue to see psychiatry in [facility].  He has no complaints of nausea, diarrhea or weight loss and starting Stribild   Review of Systems  Constitutional: Negative for activity change, appetite change and fatigue.  HENT: Negative for sore throat and trouble swallowing.   Respiratory: Negative for cough and shortness of breath.   Cardiovascular: Negative for chest pain and leg swelling.  Gastrointestinal: Negative for nausea and diarrhea.  Musculoskeletal: Negative for myalgias and arthralgias.  Skin: Negative for rash.  Neurological: Negative for dizziness and headaches.  Hematological: Negative for adenopathy.  Psychiatric/Behavioral: Positive for dysphoric mood. Negative for suicidal ideas and sleep disturbance.       Objective:   Physical Exam  Constitutional: He appears well-developed and well-nourished. No distress.  HENT:  Mouth/Throat: No oropharyngeal exudate.  Eyes: Right eye exhibits no discharge. Left eye exhibits no discharge. No scleral icterus.  Cardiovascular: Normal rate, regular rhythm and normal heart sounds.   No murmur heard. Pulmonary/Chest: Effort normal and breath sounds normal. No respiratory distress. He has no wheezes.  Lymphadenopathy:    He has no cervical adenopathy.  Skin: Skin is dry. No rash noted.  Psychiatric:  Flat affect          Assessment & Plan:

## 2013-10-15 ENCOUNTER — Other Ambulatory Visit: Payer: Self-pay | Admitting: Internal Medicine

## 2013-11-12 ENCOUNTER — Other Ambulatory Visit: Payer: Self-pay | Admitting: Internal Medicine

## 2013-11-22 ENCOUNTER — Encounter: Payer: Self-pay | Admitting: Internal Medicine

## 2013-11-22 ENCOUNTER — Ambulatory Visit (INDEPENDENT_AMBULATORY_CARE_PROVIDER_SITE_OTHER): Payer: Medicare Other | Admitting: Internal Medicine

## 2013-11-22 VITALS — BP 145/94 | HR 70 | Temp 97.6°F | Ht 72.0 in | Wt 190.0 lb

## 2013-11-22 DIAGNOSIS — B20 Human immunodeficiency virus [HIV] disease: Secondary | ICD-10-CM

## 2013-11-22 DIAGNOSIS — K219 Gastro-esophageal reflux disease without esophagitis: Secondary | ICD-10-CM

## 2013-11-22 NOTE — Assessment & Plan Note (Signed)
Still having issues, encouraged him to discuss with his PCP.

## 2013-11-22 NOTE — Assessment & Plan Note (Signed)
Doing well, will check labs today.  RTC 3-4 months if ok.

## 2013-11-22 NOTE — Progress Notes (Signed)
  Subjective:    Patient ID: Paul Saunders, male    DOB: 11/15/1961, 53 y.o.   MRN: [id number]  HPI  He comes in for followup. He has a long history of HIV and has been on Atripla I changed him to Stribild previously due to his persistent depression, anxiety.  He has not noted  Any changes in his depression after changing but certainly not worse. He does continue to see psychiatry in [facility].  He still has diarrhea and reflux, responds to Brink's Companylka Seltzer.     Review of Systems  Constitutional: Negative for activity change, appetite change and fatigue.  HENT: Negative for sore throat and trouble swallowing.   Respiratory: Negative for cough and shortness of breath.   Cardiovascular: Negative for chest pain and leg swelling.  Gastrointestinal: Negative for nausea and diarrhea.  Musculoskeletal: Negative for arthralgias and myalgias.  Skin: Negative for rash.  Neurological: Negative for dizziness and headaches.  Hematological: Negative for adenopathy.  Psychiatric/Behavioral: Negative for suicidal ideas, sleep disturbance and dysphoric mood.       Objective:   Physical Exam  Constitutional: He appears well-developed and well-nourished. No distress.  HENT:  Mouth/Throat: No oropharyngeal exudate.  Eyes: Right eye exhibits no discharge. Left eye exhibits no discharge. No scleral icterus.  Cardiovascular: Normal rate, regular rhythm and normal heart sounds.   No murmur heard. Pulmonary/Chest: Effort normal and breath sounds normal. No respiratory distress. He has no wheezes.  Lymphadenopathy:    He has no cervical adenopathy.  Skin: Skin is dry. No rash noted.  Psychiatric:  Flat affect          Assessment & Plan:

## 2013-11-24 LAB — HIV-1 RNA QUANT-NO REFLEX-BLD

## 2013-11-24 LAB — T-HELPER CELL (CD4) - (RCID CLINIC ONLY)
CD4 % Helper T Cell: 31 % — ABNORMAL LOW (ref 33–55)
CD4 T CELL ABS: 1050 /uL (ref 400–2700)

## 2013-12-15 ENCOUNTER — Other Ambulatory Visit: Payer: Self-pay | Admitting: Internal Medicine

## 2014-01-12 ENCOUNTER — Other Ambulatory Visit: Payer: Self-pay | Admitting: Internal Medicine

## 2014-01-23 ENCOUNTER — Ambulatory Visit (INDEPENDENT_AMBULATORY_CARE_PROVIDER_SITE_OTHER): Payer: Medicare Other | Admitting: Internal Medicine

## 2014-01-23 ENCOUNTER — Encounter: Payer: Self-pay | Admitting: Internal Medicine

## 2014-01-23 VITALS — BP 141/92 | HR 71 | Temp 97.1°F | Ht 72.0 in | Wt 191.6 lb

## 2014-01-23 DIAGNOSIS — E785 Hyperlipidemia, unspecified: Secondary | ICD-10-CM

## 2014-01-23 DIAGNOSIS — R197 Diarrhea, unspecified: Secondary | ICD-10-CM | POA: Diagnosis not present

## 2014-01-23 DIAGNOSIS — K219 Gastro-esophageal reflux disease without esophagitis: Secondary | ICD-10-CM

## 2014-01-23 DIAGNOSIS — F329 Major depressive disorder, single episode, unspecified: Secondary | ICD-10-CM

## 2014-01-23 DIAGNOSIS — E538 Deficiency of other specified B group vitamins: Secondary | ICD-10-CM | POA: Diagnosis not present

## 2014-01-23 DIAGNOSIS — I10 Essential (primary) hypertension: Secondary | ICD-10-CM

## 2014-01-23 DIAGNOSIS — B20 Human immunodeficiency virus [HIV] disease: Secondary | ICD-10-CM | POA: Diagnosis not present

## 2014-01-23 DIAGNOSIS — F3289 Other specified depressive episodes: Secondary | ICD-10-CM

## 2014-01-23 DIAGNOSIS — F410 Panic disorder [episodic paroxysmal anxiety] without agoraphobia: Secondary | ICD-10-CM

## 2014-01-23 DIAGNOSIS — F32A Depression, unspecified: Secondary | ICD-10-CM | POA: Insufficient documentation

## 2014-01-23 DIAGNOSIS — K529 Noninfective gastroenteritis and colitis, unspecified: Secondary | ICD-10-CM

## 2014-01-23 NOTE — Assessment & Plan Note (Signed)
Last lipid panel at goal on lipitor 80 mg qd without side effects -check Lipid panel at next f/u

## 2014-01-23 NOTE — Assessment & Plan Note (Signed)
Managed and receives injections in [place] by outside provider

## 2014-01-23 NOTE — Assessment & Plan Note (Signed)
Controlled [facility]ram [facility]

## 2014-01-23 NOTE — Assessment & Plan Note (Addendum)
BP Readings from Last 3 Encounters:  01/23/14 141/92  11/22/13 145/94  07/21/13 100/67    Lab Results  Component Value Date   NA 142 05/17/2013   K 3.4* 05/17/2013   CREATININE 0.92 05/17/2013    Assessment: Blood pressure control:   Progress toward BP goal:    Comments:   Plan: Medications:  continue current medications Educational resources provided: brochure Self management tools provided:   Other plans: cont atenolol 25 mg bid -check electrolytes at next f/u

## 2014-01-23 NOTE — Patient Instructions (Signed)
General Instructions: We have refilled your medications. Please keep your follow-up appointment with psychiatry and Dr. Luciana Axeomer. If your mood worsens with thoughts of harming yourself please contact the clinic r go to your nearest Emergency Department. Follow-up in 6 months.   Treatment Goals:  Goals (1 Years of Data) as of 01/23/14         As of Today 11/22/13 07/21/13 05/17/13 05/09/13     Blood Pressure    . Blood Pressure < 140/90  141/92 145/94 100/67 119/81 126/85      Progress Toward Treatment Goals:  Treatment Goal 05/09/2013  Blood pressure at goal    Self Care Goals & Plans:  Self Care Goal 01/23/2014  Manage my medications take my medicines as prescribed; bring my medications to every visit; refill my medications on time  Eat healthy foods eat foods that are low in salt; eat baked foods instead of fried foods    No flowsheet data found.   Care Management & Community Referrals:  No flowsheet data found.

## 2014-01-23 NOTE — Progress Notes (Signed)
   Subjective:    Patient ID: Paul Saunders, male    DOB: 1961-11-11, 53 y.o.   MRN: [id number]  HPI  Presents for follow-up of hypertension and diabetes.  Hx is significant well controlled HIV on Stribild followed by [facility], depression followed by psychiatry in [place], [place] with recent change from Zoloft to escitalopram.  Pt still reports depression which he states has not significantly improved since switch from Atripla to Stribild and Zoloft to escitalopram but has noticed an improvement in his anxiety. No plan to harm himself or other.  Overall states that he is doing "okay".  Review of Systems  Constitutional: Negative.   HENT: Negative.   Eyes: Negative.   Respiratory: Negative.   Cardiovascular: Negative.   Gastrointestinal: Positive for diarrhea.       Chronic diarrhea  Endocrine: Negative.   Genitourinary: Negative.   Neurological: Negative.   Psychiatric/Behavioral: Positive for suicidal ideas and dysphoric mood. The patient is not nervous/anxious.        No plan       Objective:   Physical Exam  Constitutional: He is oriented to person, place, and time. He appears well-developed and well-nourished. No distress.  HENT:  Head: Normocephalic and atraumatic.  Eyes: Conjunctivae and EOM are normal. Pupils are equal, round, and reactive to light.  Neck: Normal range of motion. Neck supple.  Cardiovascular: Normal rate, regular rhythm, normal heart sounds and intact distal pulses.   Pulmonary/Chest: Effort normal and breath sounds normal.  Abdominal: Soft. Bowel sounds are normal. There is no tenderness.  Musculoskeletal: Normal range of motion.  Neurological: He is alert and oriented to person, place, and time.  Skin: Skin is warm and dry.  Psychiatric: His speech is normal and behavior is normal. Judgment and thought content normal. His mood appears not anxious. His affect is blunt. Cognition and memory are normal. He exhibits a depressed mood.          Assessment & Plan:   See separate problem-list charting:

## 2014-01-23 NOTE — Assessment & Plan Note (Signed)
Refilled Protonix today. 

## 2014-01-23 NOTE — Assessment & Plan Note (Signed)
Stable

## 2014-01-23 NOTE — Assessment & Plan Note (Signed)
Managed by [facility] on Stribild

## 2014-01-23 NOTE — Assessment & Plan Note (Signed)
Stable on escitalopram 20 mg qd.  Recent change from Atripla to Stribild (HIV) with hopes for some minimal improvement in depressive symptoms.  Continued thoughts of suicide but denies a plan or homicidal ideation.  -scheduled to [facility]. -alarms signs to seek help discussed with pt

## 2014-01-25 NOTE — Progress Notes (Signed)
Case discussed with Dr. Schooler soon after the resident saw the patient.  We reviewed the resident's history and exam and pertinent patient test results.  I agree with the assessment, diagnosis, and plan of care documented in the resident's note. 

## 2014-02-13 ENCOUNTER — Other Ambulatory Visit: Payer: Self-pay | Admitting: Internal Medicine

## 2014-03-23 ENCOUNTER — Ambulatory Visit (INDEPENDENT_AMBULATORY_CARE_PROVIDER_SITE_OTHER): Payer: Medicare Other | Admitting: Internal Medicine

## 2014-03-23 ENCOUNTER — Encounter: Payer: Self-pay | Admitting: Internal Medicine

## 2014-03-23 VITALS — BP 129/89 | HR 71 | Temp 98.5°F | Ht 72.0 in | Wt 188.0 lb

## 2014-03-23 DIAGNOSIS — B20 Human immunodeficiency virus [HIV] disease: Secondary | ICD-10-CM

## 2014-03-23 LAB — COMPLETE METABOLIC PANEL WITH GFR
ALK PHOS: 79 U/L (ref 39–117)
ALT: 17 U/L (ref 0–53)
AST: 21 U/L (ref 0–37)
Albumin: 4.6 g/dL (ref 3.5–5.2)
BILIRUBIN TOTAL: 0.4 mg/dL (ref 0.2–1.2)
BUN: 15 mg/dL (ref 6–23)
CO2: 21 meq/L (ref 19–32)
CREATININE: 1.05 mg/dL (ref 0.50–1.35)
Calcium: 9.6 mg/dL (ref 8.4–10.5)
Chloride: 108 mEq/L (ref 96–112)
GFR, Est Non African American: 82 mL/min
Glucose, Bld: 102 mg/dL — ABNORMAL HIGH (ref 70–99)
Potassium: 4.3 mEq/L (ref 3.5–5.3)
SODIUM: 140 meq/L (ref 135–145)
TOTAL PROTEIN: 7.6 g/dL (ref 6.0–8.3)

## 2014-03-23 LAB — CBC WITH DIFFERENTIAL/PLATELET
BASOS PCT: 0 % (ref 0–1)
Basophils Absolute: 0 10*3/uL (ref 0.0–0.1)
EOS ABS: 0 10*3/uL (ref 0.0–0.7)
Eosinophils Relative: 0 % (ref 0–5)
HCT: 39.6 % (ref 39.0–52.0)
HEMOGLOBIN: 14.3 g/dL (ref 13.0–17.0)
LYMPHS ABS: 3.5 10*3/uL (ref 0.7–4.0)
Lymphocytes Relative: 56 % — ABNORMAL HIGH (ref 12–46)
MCH: 33.1 pg (ref 26.0–34.0)
MCHC: 36.1 g/dL — AB (ref 30.0–36.0)
MCV: 91.7 fL (ref 78.0–100.0)
Monocytes Absolute: 0.3 10*3/uL (ref 0.1–1.0)
Monocytes Relative: 5 % (ref 3–12)
NEUTROS PCT: 39 % — AB (ref 43–77)
Neutro Abs: 2.5 10*3/uL (ref 1.7–7.7)
Platelets: 205 10*3/uL (ref 150–400)
RBC: 4.32 MIL/uL (ref 4.22–5.81)
RDW: 14.7 % (ref 11.5–15.5)
WBC: 6.3 10*3/uL (ref 4.0–10.5)

## 2014-03-23 NOTE — Assessment & Plan Note (Signed)
Doing well by his report.  Labs today and rtc 4 months unless otherwise indicated.

## 2014-03-23 NOTE — Progress Notes (Signed)
  Subjective:    Patient ID: Paul Saunders, male    DOB: 11-07-1961, 53 y.o.   MRN: [id number]  HPI  He comes in for followup. He has a long history of HIV and has been on Atripla I changed him to Stribild previously due to his persistent depression, anxiety.  He did not note any changes in his depression after changing but certainly not worse. [facility].  No labs prior to appt.     Review of Systems  Constitutional: Negative for activity change, appetite change and fatigue.  HENT: Negative for sore throat and trouble swallowing.   Respiratory: Negative for cough and shortness of breath.   Cardiovascular: Negative for chest pain and leg swelling.  Gastrointestinal: Negative for nausea and diarrhea.  Musculoskeletal: Negative for arthralgias and myalgias.  Skin: Negative for rash.  Neurological: Negative for dizziness and headaches.  Hematological: Negative for adenopathy.  Psychiatric/Behavioral: Negative for suicidal ideas, sleep disturbance and dysphoric mood.       Objective:   Physical Exam  Constitutional: He appears well-developed and well-nourished. No distress.  HENT:  Mouth/Throat: No oropharyngeal exudate.  Eyes: Right eye exhibits no discharge. Left eye exhibits no discharge. No scleral icterus.  Cardiovascular: Normal rate, regular rhythm and normal heart sounds.   No murmur heard. Pulmonary/Chest: Effort normal and breath sounds normal. No respiratory distress. He has no wheezes.  Lymphadenopathy:    He has no cervical adenopathy.  Skin: Skin is dry. No rash noted.  Psychiatric:  Flat affect          Assessment & Plan:

## 2014-03-24 LAB — T-HELPER CELL (CD4) - (RCID CLINIC ONLY)
CD4 % Helper T Cell: 28 % — ABNORMAL LOW (ref 33–55)
CD4 T CELL ABS: 950 /uL (ref 400–2700)

## 2014-03-25 LAB — HIV-1 RNA QUANT-NO REFLEX-BLD: HIV 1 RNA Quant: <20 copies/mL (ref <20)

## 2014-05-12 ENCOUNTER — Other Ambulatory Visit: Payer: Self-pay | Admitting: Internal Medicine

## 2014-06-14 ENCOUNTER — Other Ambulatory Visit: Payer: Self-pay | Admitting: *Deleted

## 2014-06-14 NOTE — Telephone Encounter (Signed)
Concern over atora and Striblid. Will forward to Dr Selena BattenKim for review.  Needs Sept appt with PCP, may go later if needed.

## 2014-06-14 NOTE — Telephone Encounter (Signed)
The interaction may increase concentrations of atorvastatin (data are lacking regarding the actual incidence of this).  Since patient has been on this combination for some time, I would recommend monitoring for symptoms of myopathy (and CPK if symptoms are present) during the next office visit.

## 2014-06-15 ENCOUNTER — Telehealth: Payer: Self-pay | Admitting: *Deleted

## 2014-06-15 DIAGNOSIS — B20 Human immunodeficiency virus [HIV] disease: Secondary | ICD-10-CM

## 2014-06-15 MED ORDER — ELVITEG-COBIC-EMTRICIT-TENOFDF 150-150-200-300 MG PO TABS: ORAL_TABLET | ORAL | Status: DC

## 2014-06-15 NOTE — Telephone Encounter (Signed)
Will plan to change medication to rosuvastatin.  Will need to speak with patient prior to change.   Called patient, LM on voicemail at number provided.    FYI if he should call back.

## 2014-06-15 NOTE — Telephone Encounter (Signed)
Needing Stribild refill.

## 2014-06-16 NOTE — Telephone Encounter (Signed)
Given the question about which statin is best in combination with Stribild, will forward to patient's ID physician Dr. Luciana Axeomer who is away this week but will be back next week.

## 2014-06-19 ENCOUNTER — Other Ambulatory Visit: Payer: Self-pay | Admitting: Internal Medicine

## 2014-06-19 DIAGNOSIS — E785 Hyperlipidemia, unspecified: Secondary | ICD-10-CM

## 2014-06-19 DIAGNOSIS — K219 Gastro-esophageal reflux disease without esophagitis: Secondary | ICD-10-CM

## 2014-06-19 MED ORDER — ATORVASTATIN CALCIUM 80 MG PO TABS: 80.0000 mg | ORAL_TABLET | Freq: Every day | ORAL | Status: DC

## 2014-06-19 MED ORDER — PANTOPRAZOLE SODIUM 40 MG PO TBEC: 40.0000 mg | DELAYED_RELEASE_TABLET | Freq: Every day | ORAL | Status: DC

## 2014-06-19 MED ORDER — ROSUVASTATIN CALCIUM 20 MG PO TABS: 20.0000 mg | ORAL_TABLET | Freq: Every day | ORAL | Status: AC

## 2014-06-19 NOTE — Telephone Encounter (Signed)
I spoke with Mr. Paul Saunders and changed his statin to rosuvastatin 20mg  daily.  I discussed the plan with him and he did not have any questions.   Debe CoderMULLEN, Paul Hunsinger, MD

## 2014-06-19 NOTE — Telephone Encounter (Signed)
I think rosuvastatin will be fine or just monitor on atorvastatin.  thanks

## 2014-07-25 ENCOUNTER — Ambulatory Visit: Payer: Medicare Other | Admitting: Internal Medicine

## 2014-08-08 ENCOUNTER — Encounter: Payer: Self-pay | Admitting: Internal Medicine

## 2014-08-08 ENCOUNTER — Ambulatory Visit (INDEPENDENT_AMBULATORY_CARE_PROVIDER_SITE_OTHER): Payer: Medicare Other | Admitting: Internal Medicine

## 2014-08-08 VITALS — BP 134/87 | HR 66 | Temp 98.3°F | Wt 182.0 lb

## 2014-08-08 DIAGNOSIS — Z79899 Other long term (current) drug therapy: Secondary | ICD-10-CM

## 2014-08-08 DIAGNOSIS — B20 Human immunodeficiency virus [HIV] disease: Secondary | ICD-10-CM

## 2014-08-08 DIAGNOSIS — Z23 Encounter for immunization: Secondary | ICD-10-CM

## 2014-08-08 DIAGNOSIS — Z113 Encounter for screening for infections with a predominantly sexual mode of transmission: Secondary | ICD-10-CM

## 2014-08-08 LAB — CBC WITH DIFFERENTIAL/PLATELET
BASOS ABS: 0 10*3/uL (ref 0.0–0.1)
BASOS PCT: 0 % (ref 0–1)
EOS PCT: 1 % (ref 0–5)
Eosinophils Absolute: 0.1 10*3/uL (ref 0.0–0.7)
HCT: 39.6 % (ref 39.0–52.0)
Hemoglobin: 14.5 g/dL (ref 13.0–17.0)
Lymphocytes Relative: 47 % — ABNORMAL HIGH (ref 12–46)
Lymphs Abs: 3.6 10*3/uL (ref 0.7–4.0)
MCH: 34.4 pg — ABNORMAL HIGH (ref 26.0–34.0)
MCHC: 36.6 g/dL — AB (ref 30.0–36.0)
MCV: 93.8 fL (ref 78.0–100.0)
MONO ABS: 0.5 10*3/uL (ref 0.1–1.0)
Monocytes Relative: 7 % (ref 3–12)
NEUTROS ABS: 3.5 10*3/uL (ref 1.7–7.7)
Neutrophils Relative %: 45 % (ref 43–77)
Platelets: 197 10*3/uL (ref 150–400)
RBC: 4.22 MIL/uL (ref 4.22–5.81)
RDW: 14.2 % (ref 11.5–15.5)
WBC: 7.7 10*3/uL (ref 4.0–10.5)

## 2014-08-08 NOTE — Progress Notes (Signed)
  Subjective:    Patient ID: Paul Saunders, male    DOB: July 17, 1961, 53 y.o.   MRN: [id number]  HPI He comes in for followup. He has a long history of HIV and has been on Atripla I changed him to Stribild previously due to his persistent depression, anxiety.  He did not note any changes in his depression after changing but certainly not worse. He does continue to see psychiatry in [facility].  No labs prior to appt.  Doing well.      Review of Systems  Constitutional: Negative for activity change, appetite change and fatigue.  HENT: Negative for sore throat and trouble swallowing.   Respiratory: Negative for cough and shortness of breath.   Cardiovascular: Negative for chest pain and leg swelling.  Gastrointestinal: Negative for nausea and diarrhea.  Musculoskeletal: Negative for arthralgias and myalgias.  Skin: Negative for rash.  Neurological: Negative for dizziness and headaches.  Hematological: Negative for adenopathy.  Psychiatric/Behavioral: Negative for suicidal ideas, sleep disturbance and dysphoric mood.       Objective:   Physical Exam  Constitutional: He appears well-developed and well-nourished. No distress.  HENT:  Mouth/Throat: No oropharyngeal exudate.  Eyes: Right eye exhibits no discharge. Left eye exhibits no discharge. No scleral icterus.  Cardiovascular: Normal rate, regular rhythm and normal heart sounds.   No murmur heard. Pulmonary/Chest: Effort normal and breath sounds normal. No respiratory distress. He has no wheezes.  Lymphadenopathy:    He has no cervical adenopathy.  Skin: Skin is dry. No rash noted.  Psychiatric:  Flat affect          Assessment & Plan:

## 2014-08-08 NOTE — Assessment & Plan Note (Signed)
Doing well.  Labs today and rtc 4 months if reassuring.

## 2014-08-09 LAB — COMPLETE METABOLIC PANEL WITH GFR
ALBUMIN: 5.1 g/dL (ref 3.5–5.2)
ALT: 25 U/L (ref 0–53)
AST: 24 U/L (ref 0–37)
Alkaline Phosphatase: 76 U/L (ref 39–117)
BUN: 15 mg/dL (ref 6–23)
CALCIUM: 9.9 mg/dL (ref 8.4–10.5)
CHLORIDE: 106 meq/L (ref 96–112)
CO2: 22 mEq/L (ref 19–32)
Creat: 0.94 mg/dL (ref 0.50–1.35)
GFR, Est African American: >89 mL/min
GLUCOSE: 91 mg/dL (ref 70–99)
POTASSIUM: 3.7 meq/L (ref 3.5–5.3)
Sodium: 141 mEq/L (ref 135–145)
Total Bilirubin: 0.3 mg/dL (ref 0.2–1.2)
Total Protein: 7.9 g/dL (ref 6.0–8.3)

## 2014-08-09 LAB — LIPID PANEL
Cholesterol: 140 mg/dL (ref 0–200)
HDL: 43 mg/dL (ref >39)
LDL CALC: 38 mg/dL (ref 0–99)
Total CHOL/HDL Ratio: 3.3 Ratio
Triglycerides: 294 mg/dL — ABNORMAL HIGH (ref <150)
VLDL: 59 mg/dL — ABNORMAL HIGH (ref 0–40)

## 2014-08-09 LAB — RPR

## 2014-08-10 LAB — T-HELPER CELL (CD4) - (RCID CLINIC ONLY)
CD4 T CELL HELPER: 33 % (ref 33–55)
CD4 T Cell Abs: 1130 /uL (ref 400–2700)

## 2014-08-10 LAB — HIV-1 RNA QUANT-NO REFLEX-BLD
HIV 1 RNA Quant: <20 copies/mL (ref <20)
HIV-1 RNA Quant, Log: <1.3 {Log} (ref <1.30)

## 2014-08-13 ENCOUNTER — Other Ambulatory Visit: Payer: Self-pay | Admitting: Internal Medicine

## 2014-08-13 DIAGNOSIS — K219 Gastro-esophageal reflux disease without esophagitis: Secondary | ICD-10-CM

## 2014-08-13 DIAGNOSIS — I10 Essential (primary) hypertension: Secondary | ICD-10-CM

## 2014-12-11 ENCOUNTER — Other Ambulatory Visit: Payer: Self-pay | Admitting: Internal Medicine

## 2014-12-12 ENCOUNTER — Ambulatory Visit: Payer: Medicare Other | Admitting: Internal Medicine

## 2015-01-03 ENCOUNTER — Telehealth: Payer: Self-pay | Admitting: *Deleted

## 2015-01-03 NOTE — Telephone Encounter (Signed)
Patient called, requesting his records be sent to [place], [place]. He has moved, will be establishing care there.  This RN advised patient that we could not send information there unless we had a signed release of information granting us authorization.  RN advised patient to go to his new clinic, sign their release, have them fax it to [phone].  Pt verbalized understanding.  He states he has not missed any medication, has enough available while he establishes care. Will notify [facility] that the patient has relocated. Andree CossHowell, Sherika Kubicki M, RN

## 2015-01-04 ENCOUNTER — Ambulatory Visit: Payer: Medicare Other | Admitting: Internal Medicine

## 2015-01-24 ENCOUNTER — Telehealth: Payer: Self-pay | Admitting: *Deleted

## 2015-01-24 NOTE — Telephone Encounter (Signed)
[facility], receved ROI.  Last OV 08/08/14.

## 2015-06-09 ENCOUNTER — Other Ambulatory Visit: Payer: Self-pay | Admitting: Internal Medicine

## 2015-08-09 ENCOUNTER — Other Ambulatory Visit: Payer: Self-pay | Admitting: Internal Medicine

## 2016-09-25 ENCOUNTER — Telehealth: Payer: Self-pay | Admitting: *Deleted

## 2016-09-25 ENCOUNTER — Encounter: Payer: Self-pay | Admitting: *Deleted

## 2016-09-25 DIAGNOSIS — Z21 Asymptomatic human immunodeficiency virus [HIV] infection status: Secondary | ICD-10-CM | POA: Insufficient documentation

## 2016-09-25 DIAGNOSIS — B20 Human immunodeficiency virus [HIV] disease: Secondary | ICD-10-CM | POA: Insufficient documentation
